# Patient Record
Sex: Female | Born: 1960
Health system: Southern US, Community
[De-identification: ages and names within clinical notes are randomized; demographics above are authoritative.]

## PROBLEM LIST (undated history)

## (undated) DIAGNOSIS — I639 Cerebral infarction, unspecified: Secondary | ICD-10-CM

## (undated) DIAGNOSIS — Z6791 Unspecified blood type, Rh negative: Secondary | ICD-10-CM

## (undated) DIAGNOSIS — D649 Anemia, unspecified: Secondary | ICD-10-CM

## (undated) DIAGNOSIS — E7800 Pure hypercholesterolemia, unspecified: Secondary | ICD-10-CM

## (undated) DIAGNOSIS — I10 Essential (primary) hypertension: Secondary | ICD-10-CM

## (undated) DIAGNOSIS — H5462 Unqualified visual loss, left eye, normal vision right eye: Secondary | ICD-10-CM

## (undated) DIAGNOSIS — C50919 Malignant neoplasm of unspecified site of unspecified female breast: Secondary | ICD-10-CM

## (undated) HISTORY — DX: Unqualified visual loss, left eye, normal vision right eye: H54.62

## (undated) HISTORY — DX: Pure hypercholesterolemia, unspecified: E78.00

## (undated) HISTORY — PX: TUBAL LIGATION: SHX77

## (undated) HISTORY — DX: Cerebral infarction, unspecified: I63.9

## (undated) HISTORY — PX: MASTECTOMY: SHX3

## (undated) HISTORY — DX: Unspecified blood type, rh negative: Z67.91

## (undated) HISTORY — DX: Anemia, unspecified: D64.9

---

## 2005-07-08 ENCOUNTER — Inpatient Hospital Stay (HOSPITAL_COMMUNITY): Admission: EM | Admit: 2005-07-08 | Discharge: 2005-07-11 | Payer: Self-pay | Admitting: Emergency Medicine

## 2006-01-21 HISTORY — PX: LAPAROSCOPIC CHOLECYSTECTOMY: SUR755

## 2011-10-02 ENCOUNTER — Emergency Department (HOSPITAL_COMMUNITY): Payer: No Typology Code available for payment source

## 2011-10-02 ENCOUNTER — Emergency Department (HOSPITAL_COMMUNITY)
Admission: EM | Admit: 2011-10-02 | Discharge: 2011-10-02 | Disposition: A | Discharge status: Home / Self Care | Payer: No Typology Code available for payment source | Attending: Emergency Medicine | Admitting: Emergency Medicine

## 2011-10-02 ENCOUNTER — Encounter (HOSPITAL_COMMUNITY): Payer: Self-pay | Admitting: *Deleted

## 2011-10-02 DIAGNOSIS — Z853 Personal history of malignant neoplasm of breast: Secondary | ICD-10-CM | POA: Insufficient documentation

## 2011-10-02 DIAGNOSIS — I10 Essential (primary) hypertension: Secondary | ICD-10-CM

## 2011-10-02 DIAGNOSIS — T148XXA Other injury of unspecified body region, initial encounter: Secondary | ICD-10-CM

## 2011-10-02 HISTORY — DX: Malignant neoplasm of unspecified site of unspecified female breast: C50.919

## 2011-10-02 MED ORDER — METHOCARBAMOL 500 MG PO TABS: 1000.0000 mg | ORAL_TABLET | Freq: Four times a day (QID) | ORAL | Status: AC

## 2011-10-02 MED ORDER — ACETAMINOPHEN 325 MG PO TABS
650.0000 mg | ORAL_TABLET | Freq: Once | ORAL | Status: AC
  Administered 2011-10-02: 650 mg via ORAL
  Filled 2011-10-02: qty 2

## 2011-10-02 NOTE — ED Notes (Signed)
Rx given x1 Pt ambulating independently w/ steady gait on d/c in no acute distress, A&Ox4. D/c instructions reviewed w/ pt and family - pt and family deny any further questions or concerns at present.  
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
## 2011-10-03 NOTE — ED Provider Notes (Signed)
Medical screening examination/treatment/procedure(s) were performed by non-physician practitioner and as supervising physician I was immediately available for consultation/collaboration.  Juliet Rude. Rubin Payor, MD 10/03/11 2130

## 2013-07-21 DIAGNOSIS — I639 Cerebral infarction, unspecified: Secondary | ICD-10-CM

## 2013-07-21 HISTORY — DX: Cerebral infarction, unspecified: I63.9

## 2013-08-07 ENCOUNTER — Encounter (HOSPITAL_COMMUNITY): Payer: Self-pay | Admitting: Emergency Medicine

## 2013-08-07 ENCOUNTER — Emergency Department (HOSPITAL_COMMUNITY): Payer: BC Managed Care – PPO

## 2013-08-07 ENCOUNTER — Inpatient Hospital Stay (HOSPITAL_COMMUNITY)
Admission: EM | Admit: 2013-08-07 | Discharge: 2013-08-12 | DRG: 305 | Disposition: A | Discharge status: Home / Self Care | Payer: BC Managed Care – PPO | Attending: Internal Medicine | Admitting: Internal Medicine

## 2013-08-07 DIAGNOSIS — Z853 Personal history of malignant neoplasm of breast: Secondary | ICD-10-CM

## 2013-08-07 DIAGNOSIS — I160 Hypertensive urgency: Secondary | ICD-10-CM | POA: Diagnosis present

## 2013-08-07 DIAGNOSIS — H518 Other specified disorders of binocular movement: Secondary | ICD-10-CM | POA: Diagnosis present

## 2013-08-07 DIAGNOSIS — I10 Essential (primary) hypertension: Principal | ICD-10-CM | POA: Diagnosis present

## 2013-08-07 DIAGNOSIS — Z8249 Family history of ischemic heart disease and other diseases of the circulatory system: Secondary | ICD-10-CM

## 2013-08-07 DIAGNOSIS — I674 Hypertensive encephalopathy: Secondary | ICD-10-CM | POA: Diagnosis present

## 2013-08-07 DIAGNOSIS — R1110 Vomiting, unspecified: Secondary | ICD-10-CM

## 2013-08-07 DIAGNOSIS — Z923 Personal history of irradiation: Secondary | ICD-10-CM

## 2013-08-07 DIAGNOSIS — Z800 Family history of malignant neoplasm of digestive organs: Secondary | ICD-10-CM

## 2013-08-07 DIAGNOSIS — Z9010 Acquired absence of unspecified breast and nipple: Secondary | ICD-10-CM

## 2013-08-07 DIAGNOSIS — R112 Nausea with vomiting, unspecified: Secondary | ICD-10-CM | POA: Diagnosis present

## 2013-08-07 DIAGNOSIS — E785 Hyperlipidemia, unspecified: Secondary | ICD-10-CM | POA: Diagnosis present

## 2013-08-07 DIAGNOSIS — E669 Obesity, unspecified: Secondary | ICD-10-CM | POA: Diagnosis present

## 2013-08-07 DIAGNOSIS — Z6841 Body Mass Index (BMI) 40.0 and over, adult: Secondary | ICD-10-CM

## 2013-08-07 HISTORY — DX: Hypertensive urgency: I16.0

## 2013-08-07 HISTORY — DX: Other specified disorders of binocular movement: H51.8

## 2013-08-07 HISTORY — DX: Essential (primary) hypertension: I10

## 2013-08-07 LAB — CBC
HEMATOCRIT: 45.8 % (ref 36.0–46.0)
Hemoglobin: 14.8 g/dL (ref 12.0–15.0)
MCH: 25.8 pg — ABNORMAL LOW (ref 26.0–34.0)
MCHC: 32.3 g/dL (ref 30.0–36.0)
MCV: 79.8 fL (ref 78.0–100.0)
Platelets: 197 10*3/uL (ref 150–400)
RBC: 5.74 MIL/uL — ABNORMAL HIGH (ref 3.87–5.11)
RDW: 14 % (ref 11.5–15.5)
WBC: 8.2 10*3/uL (ref 4.0–10.5)

## 2013-08-07 LAB — COMPREHENSIVE METABOLIC PANEL
ALK PHOS: 95 U/L (ref 39–117)
ALT: 19 U/L (ref 0–35)
ANION GAP: 15 (ref 5–15)
AST: 27 U/L (ref 0–37)
Albumin: 4.1 g/dL (ref 3.5–5.2)
BUN: 14 mg/dL (ref 6–23)
CALCIUM: 9.8 mg/dL (ref 8.4–10.5)
CO2: 21 mEq/L (ref 19–32)
Chloride: 103 mEq/L (ref 96–112)
Creatinine, Ser: 0.62 mg/dL (ref 0.50–1.10)
GLUCOSE: 169 mg/dL — AB (ref 70–99)
Potassium: 4.2 mEq/L (ref 3.7–5.3)
SODIUM: 139 meq/L (ref 137–147)
Total Bilirubin: 0.3 mg/dL (ref 0.3–1.2)
Total Protein: 8.4 g/dL — ABNORMAL HIGH (ref 6.0–8.3)

## 2013-08-07 LAB — URINALYSIS, ROUTINE W REFLEX MICROSCOPIC
BILIRUBIN URINE: NEGATIVE
Glucose, UA: 100 mg/dL — AB
KETONES UR: NEGATIVE mg/dL
LEUKOCYTES UA: NEGATIVE
Nitrite: NEGATIVE
PH: 7.5 (ref 5.0–8.0)
PROTEIN: 30 mg/dL — AB
SPECIFIC GRAVITY, URINE: 1.013 (ref 1.005–1.030)
Urobilinogen, UA: 0.2 mg/dL (ref 0.0–1.0)

## 2013-08-07 LAB — LIPASE, BLOOD: LIPASE: 29 U/L (ref 11–59)

## 2013-08-07 LAB — URINE MICROSCOPIC-ADD ON

## 2013-08-07 MED ORDER — PROMETHAZINE HCL 25 MG/ML IJ SOLN
25.0000 mg | Freq: Once | INTRAMUSCULAR | Status: AC
  Administered 2013-08-07: 25 mg via INTRAVENOUS
  Filled 2013-08-07: qty 1

## 2013-08-07 MED ORDER — CEPHALEXIN 500 MG PO CAPS: 500.0000 mg | ORAL_CAPSULE | Freq: Four times a day (QID) | ORAL | Status: DC

## 2013-08-07 MED ORDER — METOCLOPRAMIDE HCL 5 MG/ML IJ SOLN
10.0000 mg | Freq: Once | INTRAMUSCULAR | Status: AC
  Administered 2013-08-07: 10 mg via INTRAVENOUS
  Filled 2013-08-07: qty 2

## 2013-08-07 MED ORDER — METRONIDAZOLE 500 MG PO TABS: 500.0000 mg | ORAL_TABLET | Freq: Two times a day (BID) | ORAL | Status: DC

## 2013-08-07 MED ORDER — MORPHINE SULFATE 4 MG/ML IJ SOLN
4.0000 mg | Freq: Once | INTRAMUSCULAR | Status: DC
  Filled 2013-08-07: qty 1

## 2013-08-07 MED ORDER — SODIUM CHLORIDE 0.9 % IV BOLUS (SEPSIS)
2000.0000 mL | Freq: Once | INTRAVENOUS | Status: AC
  Administered 2013-08-07: 2000 mL via INTRAVENOUS
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
## 2013-08-07 NOTE — ED Notes (Signed)
Bed: TS17 Expected date:  Expected time:  Means of arrival:  Comments: Ems, n/v

## 2013-08-07 NOTE — ED Notes (Signed)
Family at bedside. 

## 2013-08-07 NOTE — ED Provider Notes (Signed)
  Face-to-face evaluation   History: She began vomiting today, while at home alone. When her husband found her she was on the bed, and unable to get up. She was complaining of dizziness, and now has a headache. She began vomiting after eating some food that had been initially prepared, 5 days ago.  Exam, at 21:15- he is sedated, but able to communicate. She complains of dizziness, and denies abdominal pain  Physical exam: Obese, alert, calm, sleepy. Eyes no nystagmus. Disconjugate gaze with bilateral inability to track an object presented to her. Abdomen is soft and nontender.  Medical screening examination/treatment/procedure(s) were conducted as a shared visit with non-physician practitioner(s) and myself.  I personally evaluated the patient during the encounter  Richarda Blade, MD 08/08/13 1257

## 2013-08-07 NOTE — ED Notes (Addendum)
Per EMS pt coming from home with c/o nausea and vomiting. Pt reports it started about 2 hours ago after eating old stir-fry from the fridge. Pt was hypertensive en- route with b/p 200/100. Pt was given 4mg  zofran by EMS.

## 2013-08-07 NOTE — ED Provider Notes (Signed)
CSN: [id number]     Arrival date & time 08/07/13  1736 History   First MD Initiated Contact with Patient 08/07/13 1737     Chief Complaint  Patient presents with  . Nausea  . Emesis     (Consider location/radiation/quality/duration/timing/severity/associated sxs/prior Treatment) The history is provided by the patient, the spouse, a relative and medical records. No language interpreter was used.    Leslie Graves is a 53 y.o. female  with no known medical problems presents to the Emergency Department complaining of gradual, persistent, progressively worsening N/V and generalized abd pain onset 12:30PM.  Pt reports she ate left over stir-fry and chicken (both > 64 week old) around 11am and then began vomiting afterwards.  Pt reports NBNB emesis, no diarrhea.  NO treatments PTA.  Nothing makes it better or worse.  Pt reports associated dizziness, but she is unable to elaborate or qualify this.   Pt denies fever, chills, headache, neck pain, chest pain, SOB, diarrhea, weakness, syncope, dysuria, hematuria.  Husband reports that pt's last seen well time was 9AM when he left the house and he returned to find her vomiting around 4PM.   Past Medical History  Diagnosis Date  . Breast cancer    Past Surgical History  Procedure Laterality Date  . Cholecystectomy     No family history on file. History  Substance Use Topics  . Smoking status: Never Smoker   . Smokeless tobacco: Not on file  . Alcohol Use: No   OB History   Grav Para Term Preterm Abortions TAB SAB Ect Mult Living                 Review of Systems  Constitutional: Negative for fever, diaphoresis, appetite change, fatigue and unexpected weight change.  HENT: Negative for mouth sores and trouble swallowing.   Respiratory: Negative for cough, chest tightness, shortness of breath, wheezing and stridor.   Cardiovascular: Negative for chest pain and palpitations.  Gastrointestinal: Positive for nausea, vomiting and abdominal pain (  mild, generalized, cramping). Negative for diarrhea, constipation, blood in stool, abdominal distention and rectal pain.  Genitourinary: Negative for dysuria, urgency, frequency, hematuria, flank pain and difficulty urinating.  Musculoskeletal: Negative for back pain, neck pain and neck stiffness.  Skin: Negative for rash.  Neurological: Negative for weakness.  Hematological: Negative for adenopathy.  Psychiatric/Behavioral: Negative for confusion.  All other systems reviewed and are negative.     Allergies  Review of patient's allergies indicates no known allergies.  Home Medications   Prior to Admission medications   Not on File   BP 202/95  Pulse 89  Temp(Src) 98.5 F (36.9 C) (Oral)  Resp 21  SpO2 91% Physical Exam  Nursing note and vitals reviewed. Constitutional: She is oriented to person, place, and time. She appears well-developed and well-nourished. No distress.  Awake, alert, nontoxic appearance  HENT:  Head: Normocephalic and atraumatic.  Mouth/Throat: Oropharynx is clear and moist. No oropharyngeal exudate.  Eyes: Conjunctivae are normal. No scleral icterus.  Neck: Normal range of motion. Neck supple.  Cardiovascular: Normal rate, regular rhythm, normal heart sounds and intact distal pulses.   No murmur heard. RRR  Pulmonary/Chest: Effort normal and breath sounds normal. No respiratory distress. She has no wheezes.  Clear and equal breath sounds  Abdominal: Soft. Bowel sounds are normal. She exhibits no distension and no mass. There is no tenderness. There is no rebound and no guarding.  abd soft and nontender  Musculoskeletal: Normal range of motion. She  exhibits no edema.  Neurological: She is alert and oriented to person, place, and time. She exhibits normal muscle tone. Coordination normal.  Speech is clear and goal oriented Moves extremities without ataxia Pt alert and oriented x4; appropriately interacting and answering all questions without difficulty.   Skin: Skin is warm and dry. She is not diaphoretic.  Psychiatric: She has a normal mood and affect.    ED Course  Procedures (including critical care time) Labs Review Labs Reviewed  URINALYSIS, ROUTINE W REFLEX MICROSCOPIC - Abnormal; Notable for the following:    Glucose, UA 100 (*)    Hgb urine dipstick TRACE (*)    Protein, ur 30 (*)    All other components within normal limits  COMPREHENSIVE METABOLIC PANEL - Abnormal; Notable for the following:    Glucose, Bld 169 (*)    Total Protein 8.4 (*)    All other components within normal limits  CBC - Abnormal; Notable for the following:    RBC 5.74 (*)    MCH 25.8 (*)    All other components within normal limits  LIPASE, BLOOD  URINE MICROSCOPIC-ADD ON    Imaging Review Ct Head Wo Contrast  08/07/2013   CLINICAL DATA:  Dizziness, elevated blood pressure  EXAM: CT HEAD WITHOUT CONTRAST  TECHNIQUE: Contiguous axial images were obtained from the base of the skull through the vertex without intravenous contrast.  COMPARISON:  None.  FINDINGS: There is no evidence of mass effect, midline shift or extra-axial fluid collections. There is no evidence of a space-occupying lesion or intracranial hemorrhage. There is no evidence of a cortical-based area of acute infarction.  The ventricles and sulci are appropriate for the patient's age. The basal cisterns are patent.  Visualized portions of the orbits are unremarkable. The visualized portions of the paranasal sinuses and mastoid air cells are unremarkable.  The osseous structures are unremarkable.  IMPRESSION: Normal CT of the brain without intravenous contrast.   Electronically Signed   By: Kathreen Devoid   On: 08/07/2013 21:51     EKG Interpretation None      MDM   Final diagnoses:  Essential hypertension  Intractable vomiting with nausea, vomiting of unspecified type   Leslie Graves presents with intermittent bouts of emesis reoccurring since 12:30 today.  Emesis is NBNB and stomach  contents. Abd soft and nontender on exam.    6:33 PM Pt BP cuff does not fit her very large arm.  Manual BP 172/P.    7:30PM Pt continues to vomit.  No leukocytosis and CMP without abnormality.  UA pending.  8:19PM UA without evidence of UTI.  Will continue to monitor.  Pt continues to read HTN on monitor; no tachycardia.    9:26 PM Pt remains HTN on the monitor.  Repeat Manual BP 168/P.  Pt's last emesis approx 1 hour ago.  Pt assessed again by myself and Dr. Eulis Foster.  Pt is now having trouble tracking with her eyes; disconjugate gaze, which was not present on initial PE.  Abd remains soft and nontender.  Will obtain CT scan of her head.    10:04 PM CT head without acute abnormality.  Pt continues to c/o dizziness.  TPA not given because LSW time excludes her from qualifying for TPA.  Will consult Neurology.  No stroke swallow screen performed due to persistent vomiting.  10:15PM Discussed with Dr. Doy Mince who recommends admission to [facility] for further evaluation and MRI in the morning.    11:22 PM Discussed with Dr.  Barrino who will admit and transfer to [place], PA-C 08/07/13 2324  12:04 AM Manual BP with thigh cuff:  218/106  [facility], PA-C 08/08/13 0004  Leslie Campanelli, PA-C 08/08/13 6761

## 2013-08-08 ENCOUNTER — Encounter (HOSPITAL_COMMUNITY): Payer: Self-pay | Admitting: Family Medicine

## 2013-08-08 ENCOUNTER — Inpatient Hospital Stay (HOSPITAL_COMMUNITY): Payer: BC Managed Care – PPO

## 2013-08-08 DIAGNOSIS — R1115 Cyclical vomiting syndrome unrelated to migraine: Secondary | ICD-10-CM

## 2013-08-08 DIAGNOSIS — R42 Dizziness and giddiness: Secondary | ICD-10-CM

## 2013-08-08 DIAGNOSIS — H518 Other specified disorders of binocular movement: Secondary | ICD-10-CM

## 2013-08-08 DIAGNOSIS — R112 Nausea with vomiting, unspecified: Secondary | ICD-10-CM | POA: Diagnosis present

## 2013-08-08 DIAGNOSIS — I10 Essential (primary) hypertension: Secondary | ICD-10-CM

## 2013-08-08 LAB — LIPID PANEL
Cholesterol: 213 mg/dL — ABNORMAL HIGH (ref 0–200)
HDL: 64 mg/dL (ref >39)
LDL Cholesterol: 136 mg/dL — ABNORMAL HIGH (ref 0–99)
Total CHOL/HDL Ratio: 3.3 RATIO
Triglycerides: 67 mg/dL (ref <150)
VLDL: 13 mg/dL (ref 0–40)

## 2013-08-08 LAB — HEMOGLOBIN A1C
Hgb A1c MFr Bld: 5.8 % — ABNORMAL HIGH (ref <5.7)
MEAN PLASMA GLUCOSE: 120 mg/dL — AB (ref <117)

## 2013-08-08 LAB — CBC
HCT: 40 % (ref 36.0–46.0)
HEMOGLOBIN: 12.7 g/dL (ref 12.0–15.0)
MCH: 25.1 pg — AB (ref 26.0–34.0)
MCHC: 31.8 g/dL (ref 30.0–36.0)
MCV: 79.1 fL (ref 78.0–100.0)
Platelets: 207 10*3/uL (ref 150–400)
RBC: 5.06 MIL/uL (ref 3.87–5.11)
RDW: 14.2 % (ref 11.5–15.5)
WBC: 7.5 10*3/uL (ref 4.0–10.5)

## 2013-08-08 LAB — TSH: TSH: 0.768 u[IU]/mL (ref 0.350–4.500)

## 2013-08-08 LAB — BASIC METABOLIC PANEL
Anion gap: 17 — ABNORMAL HIGH (ref 5–15)
BUN: 10 mg/dL (ref 6–23)
CALCIUM: 9 mg/dL (ref 8.4–10.5)
CO2: 21 mEq/L (ref 19–32)
Chloride: 101 mEq/L (ref 96–112)
Creatinine, Ser: 0.62 mg/dL (ref 0.50–1.10)
GFR calc Af Amer: >90 mL/min (ref >90)
Glucose, Bld: 117 mg/dL — ABNORMAL HIGH (ref 70–99)
Potassium: 4 mEq/L (ref 3.7–5.3)
Sodium: 139 mEq/L (ref 137–147)

## 2013-08-08 LAB — TROPONIN I: Troponin I: <0.3 ng/mL (ref <0.30)

## 2013-08-08 LAB — PROTIME-INR
INR: 1 (ref 0.00–1.49)
Prothrombin Time: 13.2 seconds (ref 11.6–15.2)

## 2013-08-08 LAB — APTT: APTT: 25 s (ref 24–37)

## 2013-08-08 MED ORDER — PROMETHAZINE HCL 25 MG/ML IJ SOLN
12.5000 mg | Freq: Four times a day (QID) | INTRAMUSCULAR | Status: DC | PRN
  Administered 2013-08-08 – 2013-08-09 (×4): 12.5 mg via INTRAVENOUS
  Filled 2013-08-08 (×4): qty 1

## 2013-08-08 MED ORDER — SODIUM CHLORIDE 0.9 % IV SOLN
INTRAVENOUS | Status: DC
Start: 2013-08-08 — End: 2013-08-12
  Administered 2013-08-08: 100 mL/h via INTRAVENOUS
  Administered 2013-08-08 – 2013-08-10 (×4): via INTRAVENOUS
  Administered 2013-08-11: 100 mL/h via INTRAVENOUS

## 2013-08-08 MED ORDER — SODIUM CHLORIDE 0.9 % IJ SOLN
3.0000 mL | Freq: Two times a day (BID) | INTRAMUSCULAR | Status: DC
  Administered 2013-08-08 – 2013-08-11 (×5): 3 mL via INTRAVENOUS

## 2013-08-08 MED ORDER — ONDANSETRON HCL 4 MG/2ML IJ SOLN
4.0000 mg | Freq: Four times a day (QID) | INTRAMUSCULAR | Status: DC | PRN
  Administered 2013-08-08 – 2013-08-11 (×5): 4 mg via INTRAVENOUS
  Filled 2013-08-08 (×5): qty 2

## 2013-08-08 MED ORDER — HYDRALAZINE HCL 20 MG/ML IJ SOLN
10.0000 mg | Freq: Four times a day (QID) | INTRAMUSCULAR | Status: DC | PRN
  Administered 2013-08-08 – 2013-08-11 (×10): 10 mg via INTRAVENOUS
  Filled 2013-08-08 (×10): qty 1

## 2013-08-08 MED ORDER — HYDRALAZINE HCL 20 MG/ML IJ SOLN
5.0000 mg | Freq: Once | INTRAMUSCULAR | Status: DC
  Filled 2013-08-08: qty 1

## 2013-08-08 MED ORDER — MORPHINE SULFATE 2 MG/ML IJ SOLN
2.0000 mg | Freq: Four times a day (QID) | INTRAMUSCULAR | Status: DC | PRN
  Administered 2013-08-08 – 2013-08-10 (×7): 2 mg via INTRAVENOUS
  Filled 2013-08-08 (×7): qty 1

## 2013-08-08 MED ORDER — ASPIRIN EC 81 MG PO TBEC
81.0000 mg | DELAYED_RELEASE_TABLET | Freq: Every day | ORAL | Status: DC
  Administered 2013-08-08 – 2013-08-12 (×5): 81 mg via ORAL
  Filled 2013-08-08 (×5): qty 1

## 2013-08-08 MED ORDER — GADOBENATE DIMEGLUMINE 529 MG/ML IV SOLN
20.0000 mL | Freq: Once | INTRAVENOUS | Status: AC | PRN
  Administered 2013-08-08: 20 mL via INTRAVENOUS

## 2013-08-08 NOTE — Progress Notes (Signed)
[facility] hospitalist progress note. Chief complaint. Transfer note. History of present illness. This 53 year old female presented to [facility] emergency room with complaints of nausea and vomiting. She was noted that the been hypertensive urgency with systolic blood pressure of 200. He was noted to have disconjugate gaze. CT scan with contrast was performed and negative for any acute pathology. Patient to was transferred to [facility] for stroke workup including MRI of the brain and now has arrived. I'm seeing the patient at bedside to ensure she remains stable post discharge and then orders transferred appropriately. Patient denies headache currently. She denies any visual changes. Vital signs. Temperature 90.9, pulse 98, respiration 20, blood pressure 178/93. Previous blood pressure 227/107. General appearance. Obese middle-aged female alert and in no distress. Cardiac. Rate and rhythm regular. Lungs. Breath sounds clear and equal. Abdomen. Soft with positive bowel sounds. Neurologic. Cranial nerves 2-12 grossly intact. I did not see evidence of a disconjugate gaze. Strength 5/5 and equal upper extremities. Grips strong and equal. Lower extremity 4/5 and equal. Impression/plan. Problem #1. Nausea and vomiting. Etiology as yet unclear. Patient has no current complaints of nausea. Problem #2. Hypertensive urgency. Initial admission blood pressure 227/107 to this is improved to current 178/93. I've ordered hydralazine 10 mg IV every 6 hours as needed for systolic blood pressure greater than 180 with permissive hypertension at this time due to rule out CVA. Problem #3. Disconjugate gaze. Patient for CVA rule out. The patient will be sent to MRI for brain scan to rule out stroke. I will notify neural hospitalist of patient's arrival. We'll follow for any further neurology recommendations.

## 2013-08-08 NOTE — Evaluation (Signed)
Clinical/Bedside Swallow Evaluation Patient Details  Name: Jerry Haugen MRN: [id number] Date of Birth: [birth year]  Today's Date: 08/08/2013 Time: 1130-1158 SLP Time Calculation (min): 28 min  Past Medical History:  Past Medical History  Diagnosis Date  . Breast cancer   . Hypertension    Past Surgical History:  Past Surgical History  Procedure Laterality Date  . Cholecystectomy     HPI:  Ms. Cenci is a 53 y.o AAF who presented to the ER with family with stroke like symptoms.  Assessment / Plan / Recommendation Clinical Impression  BSE completed per Stroke Protocol.  Oropharyngeal swallow functional for regular consistency and thin liquids.  No s/s of aspiration noted throughout evaluation.  Recommend to initiate diet as tolerated secondary to patient presenting with nausea.  ST to follow briefly in acute care setting for diet tolerance due to current episode of weakness.      Aspiration Risk  Mild    Diet Recommendation Regular;Thin liquid   Liquid Administration via: Cup;Straw Medication Administration: Whole meds with liquid Supervision: Patient able to self feed    Other  Recommendations Oral Care Recommendations: Oral care BID Other Recommendations: Clarify dietary restrictions   Follow Up Recommendations  None    Frequency and Duration min 1 x/week  1 week       SLP Swallow Goals Please refer to Care Plans for listed goals    Swallow Study Prior Functional Status   No prior reports of dysphagia     General Date of Onset: 08/07/13 HPI: Ms. Wiginton is a 53 y.o AAF who presented to the ER with family. She was alert and cooperative during the encounter with admitting physician. The husband stated that he left her in the morning around 9 am in perfectly good health. Their son and the husband arrived to the house around 4pm and found where she had vomited on herself. The husband also stated she was very weak in her legs and could barely walk. She had eaten some  frozen stir fry dinner shortly before the vomiting and they thought this may be why she was vomiting. When she arrived to the ED, she was noted to be hypertensive in 200 SBP and the phenergan and reglan did not seem to help with her vomiting. She continued to vomit bilious substances while in the ED after the medication. Also of note, upon re-examining the patient, the ED MD noted a dysconjugate gaze so a CT of the head without contrast was performed which was negative for any acute pathology. Neurology was consulted and Dr. Doy Mince suggested a MRI of the brain and the patient would need to be transferred to [facility]. Type of Study: Bedside swallow evaluation Diet Prior to this Study: NPO Temperature Spikes Noted: No Respiratory Status: Room air Behavior/Cognition: Alert;Cooperative Oral Cavity - Dentition: Missing dentition Self-Feeding Abilities: Able to feed self Patient Positioning: Upright in bed Baseline Vocal Quality: Clear Volitional Cough: Strong Volitional Swallow: Able to elicit    Oral/Motor/Sensory Function Overall Oral Motor/Sensory Function: Appears within functional limits for tasks assessed   Ice Chips Ice chips: Not tested   Thin Liquid Thin Liquid: Within functional limits Presentation: Cup;Straw    Nectar Thick Nectar Thick Liquid: Not tested   Honey Thick Honey Thick Liquid: Not tested   Puree Puree: Within functional limits   Solid   GO    Solid: Within functional limits      Sharman Crate [place], [facility] 08/08/2013,11:58 AM

## 2013-08-08 NOTE — ED Notes (Signed)
MD at bedside. 

## 2013-08-08 NOTE — Progress Notes (Signed)
Report received from [facility] E.D

## 2013-08-08 NOTE — Progress Notes (Signed)
[facility]                                                                              Patient Demographics  Leslie Graves, is a 53 y.o. female, DOB - 1960/05/29, VOH:[id number]  Admit date - 08/07/2013   Admitting Physician Sandi Mealy, MD  Outpatient Primary MD for the patient is No primary provider on file.  LOS - 1   Chief Complaint  Patient presents with  . Nausea  . Emesis      HPI on 7/18//2015 Leslie Graves is a 53 y.o AAF who presented to the ER with family. She was alert and cooperative during the encounter with admitting physician. The husband stated that he left her in the morning around 9 am in perfectly good health. Their son and the husband arrived to the house around 4pm and found where she had vomited on herself. The husband also stated she was very weak in her legs and could barely walk. She had eaten some frozen stir fry dinner shortly before the vomiting and they thought this may be why she was vomiting. When she arrived to the ED, she was noted to be hypertensive in 200 SBP and the phenergan and reglan did not seem to help with her vomiting. She continued to vomit bilious substances while in the ED after the medication. Also of note, upon re-examining the patient, the ED MD noted a dysconjugate gaze so a CT of the head without contrast was performed which was negative for any acute pathology. Neurology was consulted and Dr. Doy Mince suggested a MRI of the brain and the patient would need to be transferred to [facility].   Assessment & Plan  Dizziness with Nausea/Vomiting, Concern for CVA vs Hypertensive Encephalopathy -Patient was transferred from [facility] long emergency department to [place], in -CT of the head showed no acute changes -Pending MRI of the brain -Pending results of MRI, patient will have echocardiogram and carotid Doppler, hemoglobin A1c, lipid panel -Will continue neuro checks -Neurology consulted and following -Continue aspirin 81  mg  Nausea and vomiting -Likely related to the above versus gastroenteritis -She currently afebrile no leukocytosis -Continue supportive care and antibiotics and IVF  Accelerated hypertension -Will continue patient on hydralazine as needed  History of breast cancer -Status post mastectomy and radiation in 1997, currently in remission  Obesity, BMI 119 -Patient will need to discuss with her primary care physician and exercise as well as diet modification program.  Code Status: Full   Family Communication: family at bedside  Disposition Plan: Admitted  Time Spent in minutes   30 minutes  Procedures  None  Consults   Neurology   DVT Prophylaxis  SCDs  Lab Results  Component Value Date   PLT 207 08/08/2013    Medications  Scheduled Meds: . aspirin EC  81 mg Oral Daily  . hydrALAZINE  5 mg Intravenous Once  . sodium chloride  3 mL Intravenous Q12H   Continuous Infusions: . sodium chloride 100 mL/hr (08/08/13 0348)   PRN Meds:.hydrALAZINE, ondansetron (ZOFRAN) IV, promethazine  Antibiotics    Anti-infectives   Start     Dose/Rate Route Frequency Ordered Stop   08/07/13 0000  cephALEXin (KEFLEX) 500 MG capsule  Status:  Discontinued     500 mg Oral 4 times daily 08/07/13 2206 08/07/13    08/07/13 0000  metroNIDAZOLE (FLAGYL) 500 MG tablet  Status:  Discontinued     500 mg Oral 2 times daily 08/07/13 2206 08/07/13       Subjective:   Leslie Graves seen and examined today.  Patient states she is no longer in vomiting at this time. She continues to feel nauseous and sleepy. Patient denies any abdominal pain at this time. She denies any dizziness. Patient does state that she felt weak yesterday and that she is unable to walk. She continues to complain of weakness. Patient currently denies any chest pain or shortness of breath.   Objective:   Filed Vitals:   08/08/13 0322 08/08/13 0436 08/08/13 0558 08/08/13 0812  BP: 178/93 193/93 188/86 177/77  Pulse: 98 85 83  90  Temp: 99 F (37.2 C) 99.1 F (37.3 C) 99.7 F (37.6 C) 99.1 F (37.3 C)  TempSrc: Oral Oral Oral Oral  Resp: 20 18 20 20   Height: 5\' 4"  (1.626 m)     Weight: 314.1 kg (692 lb 7.4 oz)     SpO2: 98% 95% 97% 95%    Wt Readings from Last 3 Encounters:  08/08/13 314.1 kg (692 lb 7.4 oz)     Intake/Output Summary (Last 24 hours) at 08/08/13 1012 Last data filed at 08/08/13 0600  Gross per 24 hour  Intake      0 ml  Output    100 ml  Net   -100 ml    Exam  General: Well developed, well nourished, NAD, appears stated age  HEENT: NCAT, PERRLA, EOMI, Anicteic Sclera, mucous membranes moist.   Neck: Supple, no JVD, no masses  Cardiovascular: S1 S2 auscultated, no rubs, murmurs or gallops. Regular rate and rhythm.  Respiratory: Clear to auscultation bilaterally with equal chest rise  Abdomen: Soft, obese, nontender, nondistended, + bowel sounds  Extremities: warm dry without cyanosis clubbing or edema  Neuro: AAOx3, cranial nerves grossly intact. Strength 5/5 in patient's upper and lower extremities bilaterally, but tires out with strength testing  Skin: Without rashes exudates or nodules  Psych: Normal affect and demeanor with intact judgement and insight  Data Review   Micro Results No results found for this or any previous visit (from the past 240 hour(s)).  Radiology Reports Ct Head Wo Contrast  08/07/2013   CLINICAL DATA:  Dizziness, elevated blood pressure  EXAM: CT HEAD WITHOUT CONTRAST  TECHNIQUE: Contiguous axial images were obtained from the base of the skull through the vertex without intravenous contrast.  COMPARISON:  None.  FINDINGS: There is no evidence of mass effect, midline shift or extra-axial fluid collections. There is no evidence of a space-occupying lesion or intracranial hemorrhage. There is no evidence of a cortical-based area of acute infarction.  The ventricles and sulci are appropriate for the patient's age. The basal cisterns are patent.   Visualized portions of the orbits are unremarkable. The visualized portions of the paranasal sinuses and mastoid air cells are unremarkable.  The osseous structures are unremarkable.  IMPRESSION: Normal CT of the brain without intravenous contrast.   Electronically Signed   By: Kathreen Devoid   On: 08/07/2013 21:51    CBC  Recent Labs Lab 08/07/13 1833 08/08/13 0518  WBC 8.2 7.5  HGB 14.8 12.7  HCT 45.8 40.0  PLT 197 207  MCV 79.8 79.1  MCH 25.8* 25.1*  MCHC 32.3 31.8  RDW 14.0 14.2    Chemistries   Recent Labs Lab 08/07/13 1833 08/08/13 0518  NA 139 139  K 4.2 4.0  CL 103 101  CO2 21 21  GLUCOSE 169* 117*  BUN 14 10  CREATININE 0.62 0.62  CALCIUM 9.8 9.0  AST 27  --   ALT 19  --   ALKPHOS 95  --   BILITOT 0.3  --    ------------------------------------------------------------------------------------------------------------------ estimated creatinine clearance is 201.2 ml/min (by C-G formula based on Cr of 0.62). ------------------------------------------------------------------------------------------------------------------ No results found for this basename: HGBA1C,  in the last 72 hours ------------------------------------------------------------------------------------------------------------------ No results found for this basename: CHOL, HDL, LDLCALC, TRIG, CHOLHDL, LDLDIRECT,  in the last 72 hours ------------------------------------------------------------------------------------------------------------------ No results found for this basename: TSH, T4TOTAL, FREET3, T3FREE, THYROIDAB,  in the last 72 hours ------------------------------------------------------------------------------------------------------------------ No results found for this basename: VITAMINB12, FOLATE, FERRITIN, TIBC, IRON, RETICCTPCT,  in the last 72 hours  Coagulation profile  Recent Labs Lab 08/08/13 0518  INR 1.00    No results found for this basename: DDIMER,  in the last 72  hours  Cardiac Enzymes  Recent Labs Lab 08/08/13 0518  TROPONINI <0.30   ------------------------------------------------------------------------------------------------------------------ No components found with this basename: POCBNP,     Dekota Kirlin D.O. on 08/08/2013 at 10:12 AM  Between 7am to 7pm - Pager - [phone]  After 7pm go to [email or web address] - password [email or web address]  And look for the night coverage person covering for me after hours  [facility] Office  [phone]

## 2013-08-08 NOTE — Progress Notes (Signed)
Patient arrived from [facility] E.D via care link. N.P paged

## 2013-08-08 NOTE — Progress Notes (Signed)
MRI contacted and told of the importance of getting the MRI done today. At this time, they cannot give a definite time for when the MRI will be completed, because they are "slammed from the ED," but they said that they would get to Leslie Graves "soon."

## 2013-08-08 NOTE — Progress Notes (Signed)
Utilization Review Completed.Leslie Graves T7/19/2015  

## 2013-08-08 NOTE — H&P (Addendum)
Leslie Graves is an 53 y.o. female.   Chief Complaint: N/V  HPI: Leslie Graves is a 53 y.o AAF here with her husband and son.  She is alert and cooperative during the encounter with me.  The husband states he left her this morning around 9 am in perfectly good health. Their son and the husband arrived to the house around 4pm and found where she had vomited on herself.  The husband also states she was very weak in her legs and could barely walk. She had eaten some frozen stir fry dinner shortly before the vomiting and they thought this may be why she was vomiting. When she arrived to the ED, she was noted to be hypertensive in 200 SBP and the phenergan and reglan didn't seem to help with her vomiting. She continued to vomit bilious substances while in the ED after the medication. Also of note, upon re-examining the patient, the ED MD noted a dysconjugate gaze so a CT of the head without contrast was performed which was negative for any acute pathology. Neurology was consulted and DR. Reynolds suggested a MRI of the brain and the patient would need to be transferred to [facility] for this evaluation.   Past Medical History  Diagnosis Date  . Breast cancer     Past Surgical History  Procedure Laterality Date  . Cholecystectomy      No family history on file. Social History:  reports that she has never smoked. She does not have any smokeless tobacco history on file. She reports that she does not drink alcohol or use illicit drugs.  Allergies: No Known Allergies   (Not in a hospital admission)  Results for orders placed during the hospital encounter of 08/07/13 (from the past 48 hour(s))  COMPREHENSIVE METABOLIC PANEL     Status: Abnormal   Collection Time    08/07/13  6:33 PM      Result Value Ref Range   Sodium 139  137 - 147 mEq/L   Potassium 4.2  3.7 - 5.3 mEq/L   Comment: MODERATE HEMOLYSIS     HEMOLYSIS AT THIS LEVEL MAY AFFECT RESULT   Chloride 103  96 - 112 mEq/L   CO2 21  19 - 32  mEq/L   Glucose, Bld 169 (*) 70 - 99 mg/dL   BUN 14  6 - 23 mg/dL   Creatinine, Ser 0.62  0.50 - 1.10 mg/dL   Calcium 9.8  8.4 - 10.5 mg/dL   Total Protein 8.4 (*) 6.0 - 8.3 g/dL   Albumin 4.1  3.5 - 5.2 g/dL   AST 27  0 - 37 U/L   Comment: MODERATE HEMOLYSIS     HEMOLYSIS AT THIS LEVEL MAY AFFECT RESULT   ALT 19  0 - 35 U/L   Comment: MODERATE HEMOLYSIS     HEMOLYSIS AT THIS LEVEL MAY AFFECT RESULT   Alkaline Phosphatase 95  39 - 117 U/L   Comment: MODERATE HEMOLYSIS     HEMOLYSIS AT THIS LEVEL MAY AFFECT RESULT   Total Bilirubin 0.3  0.3 - 1.2 mg/dL   GFR calc non Af Amer >90  >90 mL/min   GFR calc Af Amer >90  >90 mL/min   Comment: (NOTE)     The eGFR has been calculated using the CKD EPI equation.     This calculation has not been validated in all clinical situations.     eGFR's persistently <90 mL/min signify possible Chronic Kidney     Disease.  Anion gap 15  5 - 15  CBC     Status: Abnormal   Collection Time    08/07/13  6:33 PM      Result Value Ref Range   WBC 8.2  4.0 - 10.5 K/uL   RBC 5.74 (*) 3.87 - 5.11 MIL/uL   Hemoglobin 14.8  12.0 - 15.0 g/dL   HCT 96.0  45.4 - 09.8 %   MCV 79.8  78.0 - 100.0 fL   MCH 25.8 (*) 26.0 - 34.0 pg   MCHC 32.3  30.0 - 36.0 g/dL   RDW 11.9  14.7 - 82.9 %   Platelets 197  150 - 400 K/uL  LIPASE, BLOOD     Status: None   Collection Time    08/07/13  6:33 PM      Result Value Ref Range   Lipase 29  11 - 59 U/L  URINALYSIS, ROUTINE W REFLEX MICROSCOPIC     Status: Abnormal   Collection Time    08/07/13  7:47 PM      Result Value Ref Range   Color, Urine YELLOW  YELLOW   APPearance CLEAR  CLEAR   Specific Gravity, Urine 1.013  1.005 - 1.030   pH 7.5  5.0 - 8.0   Glucose, UA 100 (*) NEGATIVE mg/dL   Hgb urine dipstick TRACE (*) NEGATIVE   Bilirubin Urine NEGATIVE  NEGATIVE   Ketones, ur NEGATIVE  NEGATIVE mg/dL   Protein, ur 30 (*) NEGATIVE mg/dL   Urobilinogen, UA 0.2  0.0 - 1.0 mg/dL   Nitrite NEGATIVE  NEGATIVE    Leukocytes, UA NEGATIVE  NEGATIVE  URINE MICROSCOPIC-ADD ON     Status: None   Collection Time    08/07/13  7:47 PM      Result Value Ref Range   Squamous Epithelial / LPF RARE  RARE   WBC, UA 0-2  <3 WBC/hpf   Ct Head Wo Contrast  08/07/2013   CLINICAL DATA:  Dizziness, elevated blood pressure  EXAM: CT HEAD WITHOUT CONTRAST  TECHNIQUE: Contiguous axial images were obtained from the base of the skull through the vertex without intravenous contrast.  COMPARISON:  None.  FINDINGS: There is no evidence of mass effect, midline shift or extra-axial fluid collections. There is no evidence of a space-occupying lesion or intracranial hemorrhage. There is no evidence of a cortical-based area of acute infarction.  The ventricles and sulci are appropriate for the patient's age. The basal cisterns are patent.  Visualized portions of the orbits are unremarkable. The visualized portions of the paranasal sinuses and mastoid air cells are unremarkable.  The osseous structures are unremarkable.  IMPRESSION: Normal CT of the brain without intravenous contrast.   Electronically Signed   By: Elige Ko   On: 08/07/2013 21:51    Review of Systems  Constitutional: Positive for malaise/fatigue. Negative for fever, chills, weight loss and diaphoresis.  HENT: Negative for ear discharge, ear pain, hearing loss and tinnitus.   Respiratory: Negative for cough, hemoptysis, sputum production and shortness of breath.   Cardiovascular: Negative for chest pain, palpitations, orthopnea and claudication.  Gastrointestinal: Positive for nausea and vomiting. Negative for abdominal pain, diarrhea, constipation and blood in stool.  Genitourinary: Negative for dysuria, urgency, frequency and hematuria.  Musculoskeletal: Negative for back pain, joint pain, myalgias and neck pain.  Skin: Negative for itching and rash.  Neurological: Positive for focal weakness. Negative for dizziness, tingling, tremors, sensory change, speech change,  weakness and headaches.  Psychiatric/Behavioral: Negative  for depression, suicidal ideas and substance abuse.    Blood pressure 202/95, pulse 89, temperature 98.5 F (36.9 C), temperature source Oral, resp. rate 21, SpO2 91.00%. Physical Exam  Nursing note and vitals reviewed. Constitutional: She is oriented to person, place, and time.  obesed AAF in NAD, falling asleep during encounter 2/2 medication for vomiting, but arousable with voice. Able to follow commands and alert during exam  HENT:  Head: Normocephalic and atraumatic.  Right Ear: External ear normal.  Left Ear: External ear normal.  Nose: Nose normal.  Mouth/Throat: Oropharynx is clear and moist.  Eyes: Conjunctivae are normal. Pupils are equal, round, and reactive to light.  Dysconjugate gaze during EOM exam  Neck: Normal range of motion. Neck supple. No thyromegaly present.  Cardiovascular: Normal rate, regular rhythm, normal heart sounds and intact distal pulses.   Respiratory: Effort normal and breath sounds normal. No respiratory distress. She has no wheezes.  GI: Soft. Bowel sounds are normal. She exhibits no distension. There is no tenderness. There is no rebound and no guarding.  Musculoskeletal:  5/5 bilateral upper and lower extremity strength with 5/5 grip strength bilaterally  Neurological: She is alert and oriented to person, place, and time. She displays normal reflexes. She exhibits normal muscle tone. Coordination normal.  Gait not assessed due to obesity and patient actively vomiting during my encounter  Other than dysconjugate gaze, no other focal deficits noted  Skin: Skin is warm and dry.  Psychiatric: She has a normal mood and affect. Her behavior is normal. Thought content normal.     Assessment/Plan Diagnosis  N&V (nausea and vomiting) - unsure if this is from an acute gastroenteritis etiology or due to hypertensive encephalopathy? She has received reglan and phenergan without alleviation of her  vomiting. She has hypertension with SBP in the 200-220s and her n/v could very well be from a neurological source, ie neurologic emergency in the setting of an acute stroke?  Hypertensive urgency/emergency? - CT scan of head was negative. Neurology consulted and recommended MRI at [facility] to rule out CVA/etiology of her n/v.  She continues to vomit while in the ED despite antiemetics and she is very obesed to where most of the arm bp cuffs don't fit. A manual bp was attempted by PA using a thigh cuff and this read in >200s. Unsure if this is a CVA i.e ICH as the cause of her N/V and we don't want to drop bp too low. According to AHA/Stroke guidelines, whether an ischemic or hemorrhagic CVA is present, her BP is elevated and warrants treatment.  I have given Hydralazine 57m IV x 1 dose since her SBP is well above this point. I have also [facility] inform [facility] and orders are placed for MRI in the a.m. Have admitted to Dr. DRoel Cluckwith stroke protocol orders.   Dysconjugate gaze -as stated above, rule out CVA.   Full Code I have spoken with family and patient and they are in agreement with plan of care [facility] rule out CVA.  BSandi Mealy7/19/2015, 12:11 AM   C

## 2013-08-08 NOTE — Consult Note (Signed)
Referring Physician: Roel Cluck    Chief Complaint: Nausea, vomiting, dizziness  HPI: Leslie Graves is an 53 y.o. female who has been in her usual state of health until about 1pm today when she began to have dizziness and nausea/vomiting.  The dizziness was described as the room spinning. Was so severe that the patient was unable to obtain balance to walk well and was stumbling around the house.  She reports dizziness in all positions.  It is worsened with any movement.  The patient has had no fever.  No one in the house has had a GI illness.  Due to her severe dizziness EMS was called and the patient was brought in for evaluation.  On initial presentation BP was elevated at 225/93  Date last known well: Date: 08/07/2013 Time last known well: Time: 13:00 tPA Given: No: Outside time window  Past Medical History  Diagnosis Date  . Breast cancer   . Hypertension     Past Surgical History  Procedure Laterality Date  . Cholecystectomy      Family history: Father dies of a massive MI, mother died of pancreatic cancer  Social History:  reports that she has never smoked. She does not have any smokeless tobacco history on file. She reports that she does not drink alcohol or use illicit drugs.  Allergies: No Known Allergies  Medications:  I have reviewed the patient's current medications. Prior to Admission:  No prescriptions prior to admission   Scheduled: . hydrALAZINE  5 mg Intravenous Once  . sodium chloride  3 mL Intravenous Q12H    ROS: History obtained from the patient  General ROS: negative for - chills, fatigue, fever, night sweats, weight gain or weight loss Psychological ROS: negative for - behavioral disorder, hallucinations, memory difficulties, mood swings or suicidal ideation Ophthalmic ROS: negative for - blurry vision, double vision, eye pain or loss of vision ENT ROS: negative for - epistaxis, nasal discharge, oral lesions, sore throat, tinnitus  Allergy and Immunology  ROS: negative for - hives or itchy/watery eyes Hematological and Lymphatic ROS: negative for - bleeding problems, bruising or swollen lymph nodes Endocrine ROS: negative for - galactorrhea, hair pattern changes, polydipsia/polyuria or temperature intolerance Respiratory ROS: negative for - cough, hemoptysis, shortness of breath or wheezing Cardiovascular ROS: negative for - chest pain, dyspnea on exertion, edema or irregular heartbeat Gastrointestinal ROS: negative for - abdominal pain, diarrhea, hematemesis or stool incontinence Genito-Urinary ROS: negative for - dysuria, hematuria, incontinence or urinary frequency/urgency Musculoskeletal ROS: negative for - joint swelling or muscular weakness Neurological ROS: as noted in HPI Dermatological ROS: negative for rash and skin lesion changes  Physical Examination: Blood pressure 188/86, pulse 83, temperature 99.7 F (37.6 C), temperature source Oral, resp. rate 20, height 5\' 4"  (1.626 m), weight 314.1 kg (692 lb 7.4 oz), SpO2 97.00%.  Neurologic Examination: Mental Status: Alert, oriented, thought content appropriate.  Speech fluent without evidence of aphasia.  Able to follow 3 step commands without difficulty. Cranial Nerves: II: Discs flat bilaterally; Visual fields grossly normal, pupils equal, round, reactive to light and accommodation III,IV, VI: ptosis not present, extra-ocular motions intact bilaterally V,VII: smile symmetric, facial light touch sensation normal bilaterally VIII: hearing normal bilaterally IX,X: gag reflex present XI: bilateral shoulder shrug XII: midline tongue extension Motor: Right : Upper extremity   5/5    Left:     Upper extremity   5/5  Lower extremity   5/5     Lower extremity   5/5 Tone and  bulk:normal tone throughout; no atrophy noted Sensory: Pinprick and light touch intact throughout, bilaterally Deep Tendon Reflexes: 2+ and symmetric throughout Plantars: Right: downgoing   Left:  downgoing Cerebellar: normal finger-to-nose and normal heel-to-shin test Gait: Unable to test secondary to dizziness CV: pulses palpable throughout     Laboratory Studies:  Basic Metabolic Panel:  Recent Labs Lab 08/07/13 1833  NA 139  K 4.2  CL 103  CO2 21  GLUCOSE 169*  BUN 14  CREATININE 0.62  CALCIUM 9.8    Liver Function Tests:  Recent Labs Lab 08/07/13 1833  AST 27  ALT 19  ALKPHOS 95  BILITOT 0.3  PROT 8.4*  ALBUMIN 4.1    Recent Labs Lab 08/07/13 1833  LIPASE 29   No results found for this basename: AMMONIA,  in the last 168 hours  CBC:  Recent Labs Lab 08/07/13 1833 08/08/13 0518  WBC 8.2 7.5  HGB 14.8 12.7  HCT 45.8 40.0  MCV 79.8 79.1  PLT 197 207    Cardiac Enzymes: No results found for this basename: CKTOTAL, CKMB, CKMBINDEX, TROPONINI,  in the last 168 hours  BNP: No components found with this basename: POCBNP,   CBG: No results found for this basename: GLUCAP,  in the last 168 hours  Microbiology: No results found for this or any previous visit.  Coagulation Studies:  Recent Labs  08/08/13 0518  LABPROT 13.2  INR 1.00    Urinalysis:  Recent Labs Lab 08/07/13 1947  COLORURINE YELLOW  LABSPEC 1.013  PHURINE 7.5  GLUCOSEU 100*  HGBUR TRACE*  BILIRUBINUR NEGATIVE  KETONESUR NEGATIVE  PROTEINUR 30*  UROBILINOGEN 0.2  NITRITE NEGATIVE  LEUKOCYTESUR NEGATIVE    Lipid Panel: No results found for this basename: chol, trig, hdl, cholhdl, vldl, ldlcalc    HgbA1C:  No results found for this basename: HGBA1C    Urine Drug Screen:   No results found for this basename: labopia, cocainscrnur, labbenz, amphetmu, thcu, labbarb    Alcohol Level: No results found for this basename: ETH,  in the last 168 hours   Imaging: Ct Head Wo Contrast  08/07/2013   CLINICAL DATA:  Dizziness, elevated blood pressure  EXAM: CT HEAD WITHOUT CONTRAST  TECHNIQUE: Contiguous axial images were obtained from the base of the skull  through the vertex without intravenous contrast.  COMPARISON:  None.  FINDINGS: There is no evidence of mass effect, midline shift or extra-axial fluid collections. There is no evidence of a space-occupying lesion or intracranial hemorrhage. There is no evidence of a cortical-based area of acute infarction.  The ventricles and sulci are appropriate for the patient's age. The basal cisterns are patent.  Visualized portions of the orbits are unremarkable. The visualized portions of the paranasal sinuses and mastoid air cells are unremarkable.  The osseous structures are unremarkable.  IMPRESSION: Normal CT of the brain without intravenous contrast.   Electronically Signed   By: Kathreen Devoid   On: 08/07/2013 21:51    Assessment: 53 y.o. female presenting with dizziness, N/V.  Neurological examination is nonfocal.  BP was markedly elevated on presentation.  Concern was for stroke versus hypertensive encephalopathy.  Head CT has been reviewed and shows no acute changes.    Stroke Risk Factors - hypertension  Plan: 1. MRI, MRA  of the brain without contrast 2. PT consult, OT consult 3. Prophylactic therapy-Antiplatelet med: Aspirin - dose 81mg  daily 4. Agressive management of nausea and vomiting 5. Telemetry monitoring 6. Frequent neuro checks 7. BP management  Alexis Goodell, MD [facility] [phone] 08/08/2013, 6:26 AM

## 2013-08-09 DIAGNOSIS — I059 Rheumatic mitral valve disease, unspecified: Secondary | ICD-10-CM

## 2013-08-09 DIAGNOSIS — R51 Headache: Secondary | ICD-10-CM

## 2013-08-09 MED ORDER — VALPROATE SODIUM 500 MG/5ML IV SOLN
500.0000 mg | Freq: Three times a day (TID) | INTRAVENOUS | Status: DC
  Administered 2013-08-09 – 2013-08-12 (×9): 500 mg via INTRAVENOUS
  Filled 2013-08-09 (×11): qty 5

## 2013-08-09 MED ORDER — SIMVASTATIN 20 MG PO TABS
20.0000 mg | ORAL_TABLET | Freq: Every day | ORAL | Status: DC
  Administered 2013-08-09 – 2013-08-11 (×3): 20 mg via ORAL
  Filled 2013-08-09 (×3): qty 1

## 2013-08-09 MED ORDER — PERFLUTREN LIPID MICROSPHERE
INTRAVENOUS | Status: AC
  Filled 2013-08-09: qty 10

## 2013-08-09 NOTE — Progress Notes (Signed)
PT Cancellation Note  Patient Details Name: Leslie Graves MRN: [id number] DOB: Sep 27, 1960   Cancelled Treatment:     Pt currently on strict bedrest and will need increased activity order for evaluation.    Lanetta Inch Beth 08/09/2013, 7:11 AM Elwyn Reach, [place]

## 2013-08-09 NOTE — Progress Notes (Signed)
SLP Cancellation Note  Patient Details Name: Leslie Graves MRN: [id number] DOB: 1960-04-06   Cancelled treatment:       Reason Eval/Treat Not Completed: Other (comment) (pt having procedure done currently that has just started per technician, will reattempt another time)   Claudie Fisherman, [place] [facility] SLP [phone]

## 2013-08-09 NOTE — Progress Notes (Signed)
Echocardiogram 2D Echocardiogram has been performed.  Leslie Graves 08/09/2013, 12:17 PM

## 2013-08-09 NOTE — Evaluation (Signed)
Physical Therapy Evaluation Patient Details Name: Leslie Graves MRN: [id number] DOB: 22-Nov-1960 Today's Date: 08/09/2013   History of Present Illness  Pt admitted with dizziness, N/V and MRI brain without contrast revealed foci of abnormal signal affecting the brainstem, cerebellar peduncles and cerebral hemispheric deep white matter including the corpus callosum. There is a 3 mm focus of restricted  with consideration of MS vs CVA  Clinical Impression  Pt very pleasant but fatiguing quickly with activity and gait. Pt was independent and attending zumba and line dancing prior to feeling sick. Pt with brief period of dizziness with gait but no report of spinning and decreased with gait cessation, vision and tracking not assessed at this time. Pt educated for balance and gait deficits as well as plan for therapy. Pt with above and below deficits who will benefit from acute therapy to maximize mobility, function and gait to decrease burden of care.     Follow Up Recommendations Home health PT;Supervision for mobility/OOB    Equipment Recommendations  Rolling walker with 5" wheels    Recommendations for Other Services OT consult     Precautions / Restrictions Precautions Precautions: Fall      Mobility  Bed Mobility Overal bed mobility: Needs Assistance Bed Mobility: Supine to Sit     Supine to sit: Min assist     General bed mobility comments: min hand held assist to fully elevate trunk from surface  Transfers Overall transfer level: Needs assistance   Transfers: Sit to/from Stand Sit to Stand: Supervision         General transfer comment: cues for hand placement from bed and toilet with rail  Ambulation/Gait Ambulation/Gait assistance: Min assist Ambulation Distance (Feet): 15 Feet (x 2 trials with seated rest) Assistive device: 2 person hand held assist;Rolling walker (2 wheeled) Gait Pattern/deviations: Step-through pattern;Decreased stride length;Wide base of  support   Gait velocity interpretation: Below normal speed for age/gender General Gait Details: pt initially walking 5' with bil HHA when stated fatigue and turned to bathroom. After toileting walked out with RW with increased balance but continued short stride with cues for position and use of RW  Stairs            Wheelchair Mobility    Modified Rankin (Stroke Patients Only) Modified Rankin (Stroke Patients Only) Pre-Morbid Rankin Score: No symptoms Modified Rankin: Moderate disability     Balance Overall balance assessment: Needs assistance   Sitting balance-Leahy Scale: Good       Standing balance-Leahy Scale: Fair                               Pertinent Vitals/Pain No pain    Home Living Family/patient expects to be discharged to:: Private residence Living Arrangements: Spouse/significant other Available Help at Discharge: Family;Available 24 hours/day Type of Home: House Home Access: Stairs to enter   [employer] of Steps: 1 Home Layout: One level Home Equipment: None      Prior Function Level of Independence: Independent               Hand Dominance        Extremity/Trunk Assessment   Upper Extremity Assessment: Overall WFL for tasks assessed           Lower Extremity Assessment: Overall WFL for tasks assessed (4/5 bil LE all myotomes with sensation WFL)      Cervical / Trunk Assessment: Normal  Communication   Communication: No difficulties  Cognition Arousal/Alertness: Awake/alert Behavior During Therapy: WFL for tasks assessed/performed Overall Cognitive Status: Within Functional Limits for tasks assessed                      General Comments      Exercises        Assessment/Plan    PT Assessment Patient needs continued PT services  PT Diagnosis Abnormality of gait   PT Problem List Decreased activity tolerance;Decreased balance;Decreased knowledge of use of DME  PT Treatment  Interventions Gait training;DME instruction;Stair training;Functional mobility training;Therapeutic activities;Therapeutic exercise;Patient/family education;Balance training   PT Goals (Current goals can be found in the Care Plan section) Acute Rehab PT Goals Patient Stated Goal: be able to return to line dancing and zumba PT Goal Formulation: With patient/family Time For Goal Achievement: 08/23/13 Potential to Achieve Goals: Good    Frequency Min 3X/week   Barriers to discharge        Co-evaluation               End of Session   Activity Tolerance: Patient tolerated treatment well Patient left: in chair;with call bell/phone within reach;with family/visitor present Nurse Communication: Mobility status         Time: 7517-0017 PT Time Calculation (min): 14 min   Charges:   PT Evaluation $Initial PT Evaluation Tier I: 1 Procedure PT Treatments $Gait Training: 8-22 mins   PT G CodesMelford Aase 08/09/2013, 1:09 PM Elwyn Reach, [place]

## 2013-08-09 NOTE — Progress Notes (Addendum)
NEURO HOSPITALIST PROGRESS NOTE   SUBJECTIVE:                                                                                                                        Complains of HA and nausea. She tells me that she is not a HA person but since last Saturday the HA has been almost constant. Dilaudid helps to ameliorate the HA. The HA is associated with nausea and photophobia. Denies having vertigo now but said that on the day of admission to the hospital she had " spinning sensation" with imbalance, double vision, weak legs and could barely walk. When she arrived to the ED, she was noted to be hypertensive in 200 SBP and had a dysconjugate gaze.   MRI brain without contrast revealed foci of abnormal signal affecting the brainstem, cerebellar peduncles and cerebral hemispheric deep white matter including the corpus callosum. There is a 3 mm focus of restricted  diffusion in the left middle cerebellar peduncle and a few small areas in the hemispheric white matter.  OBJECTIVE:                                                                                                                           Vital signs in last 24 hours: Temp:  [98.4 F (36.9 C)-99.4 F (37.4 C)] 99.4 F (37.4 C) (07/20 1000) Pulse Rate:  [76-85] 80 (07/20 1000) Resp:  [20-22] 20 (07/20 1000) BP: (162-185)/(71-86) 164/72 mmHg (07/20 1000) SpO2:  [96 %-97 %] 97 % (07/20 1000)  Intake/Output from previous day:   Intake/Output this shift:   Nutritional status: Full Liquid  Past Medical History  Diagnosis Date  . Breast cancer   . Hypertension     Neurologic Exam:  General: Mental Status: Alert, oriented, thought content appropriate.  Speech fluent without evidence of aphasia.  Able to follow 3 step commands without difficulty. Cranial Nerves: II: Discs flat bilaterally; Visual fields grossly normal, pupils equal, round, reactive to light and accommodation III,IV, VI:  ptosis not present, extra-ocular motions intact bilaterally V,VII: smile symmetric, facial light touch sensation normal bilaterally VIII: hearing normal bilaterally IX,X: gag reflex present XI: bilateral shoulder shrug XII: midline tongue extension without atrophy or fasciculations  Motor: Right :  Upper extremity   5/5    Left:     Upper extremity   5/5  Lower extremity   5/5     Lower extremity   5/5 Tone and bulk:normal tone throughout; no atrophy noted Sensory: Pinprick and light touch intact throughout, bilaterally Deep Tendon Reflexes:  Right: Upper Extremity   Left: Upper extremity   biceps (C-5 to C-6) 2/4   biceps (C-5 to C-6) 2/4 tricep (C7) 2/4    triceps (C7) 2/4 Brachioradialis (C6) 2/4  Brachioradialis (C6) 2/4  Lower Extremity Lower Extremity  quadriceps (L-2 to L-4) 2/4   quadriceps (L-2 to L-4) 2/4 Achilles (S1) 2/4   Achilles (S1) 2/4  Plantars: Right: downgoing   Left: downgoing Cerebellar: normal finger-to-nose,  normal heel-to-shin test Gait:  No tested    Lab Results: Lab Results  Component Value Date/Time   CHOL 213* 08/08/2013 11:22 AM   Lipid Panel  Recent Labs  08/08/13 1122  CHOL 213*  TRIG 67  HDL 64  CHOLHDL 3.3  VLDL 13  LDLCALC 136*    Studies/Results: Ct Head Wo Contrast  08/07/2013   CLINICAL DATA:  Dizziness, elevated blood pressure  EXAM: CT HEAD WITHOUT CONTRAST  TECHNIQUE: Contiguous axial images were obtained from the base of the skull through the vertex without intravenous contrast.  COMPARISON:  None.  FINDINGS: There is no evidence of mass effect, midline shift or extra-axial fluid collections. There is no evidence of a space-occupying lesion or intracranial hemorrhage. There is no evidence of a cortical-based area of acute infarction.  The ventricles and sulci are appropriate for the patient's age. The basal cisterns are patent.  Visualized portions of the orbits are unremarkable. The visualized portions of the paranasal  sinuses and mastoid air cells are unremarkable.  The osseous structures are unremarkable.  IMPRESSION: Normal CT of the brain without intravenous contrast.   Electronically Signed   By: Kathreen Devoid   On: 08/07/2013 21:51   Mr Brain W Wo Contrast  08/08/2013   CLINICAL DATA:  Nausea and vomiting.  Gaze disturbance.  EXAM: MRI HEAD WITHOUT AND WITH CONTRAST  TECHNIQUE: Multiplanar, multiecho pulse sequences of the brain and surrounding structures were obtained without and with intravenous contrast.  CONTRAST:  85mL MULTIHANCE GADOBENATE DIMEGLUMINE 529 MG/ML IV SOLN  COMPARISON:  Head CT 718  FINDINGS: There are foci of abnormal T2 and FLAIR signal affecting the brainstem, middle cerebellar peduncle on the left, corpus callosum, and hemispheric deep and subcortical white matter. The pattern of involvement is suggestive of multiple sclerosis, but not entirely specific. The findings could be due to small vessel ischemic changes. There is a 3 mm focus of restricted diffusion in the middle cerebellar peduncle on the left. A few of the other hemispheric white matter lesions show a low level restricted diffusion. None of the foci show abnormal contrast enhancement. There is no cortical or large vessel territory insult. No mass lesion, hydrocephalus or extra-axial collection. There are punctate foci of hemosiderin deposition within the brain stem, thalami and scatter within the hemispheric white matter. These could be micro hemorrhages associated with old infarctions or demyelinating lesions or could relate to old closed head injury. No evidence of acute hemorrhage. No hydrocephalus or extra-axial collection. No pituitary mass. No inflammatory sinus disease.  IMPRESSION: Foci of abnormal signal affecting the brainstem, cerebellar peduncles and cerebral hemispheric deep white matter including the corpus callosum. This appearance could be due to multiple sclerosis or small-vessel ischemia. There is a 3  mm focus of  restricted diffusion in the left middle cerebellar peduncle and a few small areas in the hemispheric white matter. If the underlying pathology is multiple sclerosis, these are active lesions. If the underlying pathology is small vessel ischemia, these are acute/ subacute small vessel infarctions. I favor multiple sclerosis in this case. There is, however, the atypical feature of punctate foci of hemosiderin deposition within the brain, more commonly seen with small vessel infarctions than demyelinating disease. Is there history of old head trauma?   Electronically Signed   By: Nelson Chimes M.D.   On: 08/08/2013 17:39    MEDICATIONS                                                                                                                        Scheduled: . aspirin EC  81 mg Oral Daily  . hydrALAZINE  5 mg Intravenous Once  . sodium chloride  3 mL Intravenous Q12H    ASSESSMENT/PLAN:                                                                                                           53 y/o with new onset HA, nausea, vomiting, photophobia, vertigo, unsteadiness, double vision, and legs weakness. Except for HA, those symptoms had resolved and she has a non focal neuro-exam. MRI brain with findings concerning for demyelinating disease versus ischemia. Will review MRI again with neuro-radiologist. If findings are more consistent with demyelinating disease will pursue MRI cervical spine and LP. Trial of IV Depacon for HA. ASA 81 mg daily. Will follow up.  Dorian Pod, MD [facility] [phone]  08/09/2013, 12:15 PM

## 2013-08-09 NOTE — Progress Notes (Signed)
[facility]                                                                              Patient Demographics  Leslie Graves, is a 53 y.o. female, DOB - 05-26-1960, [id number]  Admit date - 08/07/2013   Admitting Physician Sandi Mealy, MD  Outpatient Primary MD for the patient is No primary provider on file.  LOS - 2   Chief Complaint  Patient presents with  . Nausea  . Emesis      HPI on 7/18//2015 Leslie Graves is a 53 y.o AAF who presented to the ER with family. She was alert and cooperative during the encounter with admitting physician. The husband stated that he left her in the morning around 9 am in perfectly good health. Their son and the husband arrived to the house around 4pm and found where she had vomited on herself. The husband also stated she was very weak in her legs and could barely walk. She had eaten some frozen stir fry dinner shortly before the vomiting and they thought this may be why she was vomiting. When she arrived to the ED, she was noted to be hypertensive in 200 SBP and the phenergan and reglan did not seem to help with her vomiting. She continued to vomit bilious substances while in the ED after the medication. Also of note, upon re-examining the patient, the ED MD noted a dysconjugate gaze so a CT of the head without contrast was performed which was negative for any acute pathology. Neurology was consulted and Dr. Doy Mince suggested a MRI of the brain and the patient would need to be transferred to [facility].  Interim history Patient continues to have nausea however no vomiting. Catheter we advanced her diet. Patient MRI noted some questionable demyelinating process with possible MS versus stroke. Pending further recommendations from neurology. Echocardiogram and carotid Doppler also pending.  Assessment & Plan  Dizziness with Nausea/Vomiting, Concern for CVA vs Hypertensive Encephalopathy -Patient was transferred from [facility] long emergency  department to [place], in -CT of the head showed no acute changes -MRI brain: Foci of abnormal signal affecting brain, cerebellar peduncles, deep white matter including the corpus callosum, could be sclerosis or small vessel ischemia. -Pending echocardiogram and carotid Doppler, as well as PT and OT evaluations -LDL 136, hemoglobin A1c 5.8 -Neurology consulted and following -Continue aspirin 81 mg -Will start patient on low dose statin.  Nausea and vomiting -Likely related to the above versus gastroenteritis -She currently afebrile no leukocytosis -Continue supportive care and antibiotics and IVF  Accelerated hypertension -Will continue patient on hydralazine as needed  History of breast cancer -Status post mastectomy and radiation in 1997, currently in remission  Obesity, BMI 119 -Patient will need to discuss with her primary care physician and exercise as well as diet modification program.  Hyperlipidemia -Lipid panel: Total cholesterol 213, triglycerides 67, HDL 64, LDL 136 -Will start low-dose  Code Status: Full   Family Communication: family at bedside  Disposition Plan: Admitted, pending further workup  Time Spent in minutes   25 minutes  Procedures  None  Consults   Neurology   DVT Prophylaxis  SCDs  Lab Results  Component  Value Date   PLT 207 08/08/2013    Medications  Scheduled Meds: . aspirin EC  81 mg Oral Daily  . hydrALAZINE  5 mg Intravenous Once  . sodium chloride  3 mL Intravenous Q12H   Continuous Infusions: . sodium chloride 100 mL/hr at 08/09/13 0245   PRN Meds:.hydrALAZINE, morphine injection, ondansetron (ZOFRAN) IV, promethazine  Antibiotics    Anti-infectives   Start     Dose/Rate Route Frequency Ordered Stop   08/07/13 0000  cephALEXin (KEFLEX) 500 MG capsule  Status:  Discontinued     500 mg Oral 4 times daily 08/07/13 2206 08/07/13    08/07/13 0000  metroNIDAZOLE (FLAGYL) 500 MG tablet  Status:  Discontinued     500 mg Oral 2  times daily 08/07/13 2206 08/07/13       Subjective:   Leslie Graves seen and examined today.  Patient states she is no longer in vomiting at this time, and states she would like to increase her diet. Patient denies any abdominal pain at this time. She also denies any dizziness, shortness of breath, chest.   Objective:   Filed Vitals:   08/08/13 1745 08/08/13 2212 08/09/13 0241 08/09/13 1000  BP: 180/86 185/79 162/76 164/72  Pulse: 76 81 78 80  Temp: 99.1 F (37.3 C) 98.9 F (37.2 C) 99 F (37.2 C) 99.4 F (37.4 C)  TempSrc: Oral Oral Oral Oral  Resp: 22 22 22 20   Height:      Weight:      SpO2: 96% 97%  97%    Wt Readings from Last 3 Encounters:  08/08/13 314.1 kg (692 lb 7.4 oz)    No intake or output data in the 24 hours ending 08/09/13 1020  Exam  General: Well developed, well nourished, NAD, appears stated age  HEENT: NCAT, mucous membranes moist.   Cardiovascular: S1 S2 auscultated, no rubs, murmurs or gallops. Regular rate and rhythm.  Respiratory: Clear to auscultation bilaterally with equal chest rise  Abdomen: Soft, obese, nontender, nondistended, + bowel sounds  Extremities: warm dry without cyanosis clubbing or edema  Neuro: AAOx3, no focal deficits  Skin: Without rashes exudates or nodules  Psych: Normal affect and demeanor with intact judgement and insight  Data Review   Micro Results No results found for this or any previous visit (from the past 240 hour(s)).  Radiology Reports Ct Head Wo Contrast  08/07/2013   CLINICAL DATA:  Dizziness, elevated blood pressure  EXAM: CT HEAD WITHOUT CONTRAST  TECHNIQUE: Contiguous axial images were obtained from the base of the skull through the vertex without intravenous contrast.  COMPARISON:  None.  FINDINGS: There is no evidence of mass effect, midline shift or extra-axial fluid collections. There is no evidence of a space-occupying lesion or intracranial hemorrhage. There is no evidence of a  cortical-based area of acute infarction.  The ventricles and sulci are appropriate for the patient's age. The basal cisterns are patent.  Visualized portions of the orbits are unremarkable. The visualized portions of the paranasal sinuses and mastoid air cells are unremarkable.  The osseous structures are unremarkable.  IMPRESSION: Normal CT of the brain without intravenous contrast.   Electronically Signed   By: Kathreen Devoid   On: 08/07/2013 21:51    CBC  Recent Labs Lab 08/07/13 1833 08/08/13 0518  WBC 8.2 7.5  HGB 14.8 12.7  HCT 45.8 40.0  PLT 197 207  MCV 79.8 79.1  MCH 25.8* 25.1*  MCHC 32.3 31.8  RDW 14.0 14.2  Chemistries   Recent Labs Lab 08/07/13 1833 08/08/13 0518  NA 139 139  K 4.2 4.0  CL 103 101  CO2 21 21  GLUCOSE 169* 117*  BUN 14 10  CREATININE 0.62 0.62  CALCIUM 9.8 9.0  AST 27  --   ALT 19  --   ALKPHOS 95  --   BILITOT 0.3  --    ------------------------------------------------------------------------------------------------------------------ estimated creatinine clearance is 201.2 ml/min (by C-G formula based on Cr of 0.62). ------------------------------------------------------------------------------------------------------------------  Recent Labs  08/08/13 0518  HGBA1C 5.8*   ------------------------------------------------------------------------------------------------------------------  Recent Labs  08/08/13 1122  CHOL 213*  HDL 64  LDLCALC 136*  TRIG 67  CHOLHDL 3.3   ------------------------------------------------------------------------------------------------------------------  Recent Labs  08/08/13 1122  TSH 0.768   ------------------------------------------------------------------------------------------------------------------ No results found for this basename: VITAMINB12, FOLATE, FERRITIN, TIBC, IRON, RETICCTPCT,  in the last 72 hours  Coagulation profile  Recent Labs Lab 08/08/13 0518  INR 1.00    No  results found for this basename: DDIMER,  in the last 72 hours  Cardiac Enzymes  Recent Labs Lab 08/08/13 0518  TROPONINI <0.30   ------------------------------------------------------------------------------------------------------------------ No components found with this basename: POCBNP,     Tauriel Scronce D.O. on 08/09/2013 at 10:20 AM  Between 7am to 7pm - Pager - [phone]  After 7pm go to [email or web address] - password [email or web address]  And look for the night coverage person covering for me after hours  [facility] Office  [phone]

## 2013-08-10 MED ORDER — LISINOPRIL 10 MG PO TABS
10.0000 mg | ORAL_TABLET | Freq: Every day | ORAL | Status: DC
  Administered 2013-08-10: 10 mg via ORAL
  Filled 2013-08-10: qty 1

## 2013-08-10 MED ORDER — HYDROCODONE-ACETAMINOPHEN 5-325 MG PO TABS
1.0000 | ORAL_TABLET | Freq: Four times a day (QID) | ORAL | Status: DC | PRN
  Administered 2013-08-10 – 2013-08-11 (×4): 1 via ORAL
  Filled 2013-08-10: qty 2
  Filled 2013-08-10: qty 1
  Filled 2013-08-10 (×2): qty 2
  Filled 2013-08-10: qty 1

## 2013-08-10 NOTE — Progress Notes (Signed)
VASCULAR LAB PRELIMINARY  PRELIMINARY  PRELIMINARY  PRELIMINARY  Carotid duplex  completed.    Preliminary report:  Bilateral:  1-39% ICA stenosis.  Vertebral artery flow is antegrade.      Deke Tilghman, RVT 08/10/2013, 11:08 AM

## 2013-08-10 NOTE — Progress Notes (Signed)
Speech Language Pathology Treatment: Dysphagia  Patient Details Name: Leslie Graves MRN: [id number] DOB: January 12, 1961 Today's Date: 08/10/2013 Time: 6568-1275 SLP Time Calculation (min): 8 min  Assessment / Plan / Recommendation Clinical Impression  Pt. Seen for dysphagia therapy with thin liquids (pt. Currently on full liquids due to vomiting yesterday).  Pt. Without complaints with swallow and stated she has not had emesis since last night.  No indications of oropharyngeal difficulties.  Recommend upgrade to regular if cleared with MD and thin liquids, pills with liquid.  No follow up ST needed.   HPI HPI: Leslie Graves is a 53 y.o AAF who presented to the ER with family. She was alert and cooperative during the encounter with admitting physician. The husband stated that he left her in the morning around 9 am in perfectly good health. Their son and the husband arrived to the house around 4pm and found where she had vomited on herself. The husband also stated she was very weak in her legs and could barely walk. She had eaten some frozen stir fry dinner shortly before the vomiting and they thought this may be why she was vomiting. When she arrived to the ED, she was noted to be hypertensive in 200 SBP and the phenergan and reglan did not seem to help with her vomiting. She continued to vomit bilious substances while in the ED after the medication. Also of note, upon re-examining the patient, the ED MD noted a dysconjugate gaze so a CT of the head without contrast was performed which was negative for any acute pathology. Neurology was consulted and Dr. Doy Mince suggested a MRI of the brain and the patient would need to be transferred to [facility].   Pertinent Vitals WDL  SLP Plan  Discharge SLP treatment due to (comment)    Recommendations Diet recommendations: Regular;Thin liquid Liquids provided via: Cup;Straw Medication Administration: Whole meds with liquid Supervision: Patient able to self  feed Compensations: Slow rate;Small sips/bites Postural Changes and/or Swallow Maneuvers: Seated upright 90 degrees              Oral Care Recommendations: Oral care BID Follow up Recommendations: None Plan: Discharge SLP treatment due to (comment)    GO     Cranford Mon.Ed [employer] [phone]  08/10/2013

## 2013-08-10 NOTE — Progress Notes (Addendum)
[facility]                                                                              Patient Demographics  Leslie Graves, is a 53 y.o. female, DOB - 07-May-1960, [id number]  Admit date - 08/07/2013   Admitting Physician Sandi Mealy, MD  Outpatient Primary MD for the patient is No primary provider on file.  LOS - 3   Chief Complaint  Patient presents with  . Nausea  . Emesis      HPI on 7/18//2015 Leslie Graves is a 54 y.o AAF who presented to the ER with family. She was alert and cooperative during the encounter with admitting physician. The husband stated that he left her in the morning around 9 am in perfectly good health. Their son and the husband arrived to the house around 4pm and found where she had vomited on herself. The husband also stated she was very weak in her legs and could barely walk. She had eaten some frozen stir fry dinner shortly before the vomiting and they thought this may be why she was vomiting. When she arrived to the ED, she was noted to be hypertensive in 200 SBP and the phenergan and reglan did not seem to help with her vomiting. She continued to vomit bilious substances while in the ED after the medication. Also of note, upon re-examining the patient, the ED MD noted a dysconjugate gaze so a CT of the head without contrast was performed which was negative for any acute pathology. Neurology was consulted and Dr. Doy Mince suggested a MRI of the brain and the patient would need to be transferred to [facility].  Interim history Patient continues to have nausea however no vomiting. Catheter we advanced her diet. Patient MRI noted some questionable demyelinating process with possible MS versus stroke. Pending further recommendations from neurology. Pending MRI of the cervical spine and possible LP.   Assessment & Plan  Dizziness with Nausea/Vomiting, Concern for CVA vs Hypertensive Encephalopathy -Patient was transferred from [facility] long emergency  department to [place], in -CT of the head showed no acute changes -MRI brain: Foci of abnormal signal affecting brain, cerebellar peduncles, deep white matter including the corpus callosum, could be sclerosis or small vessel ischemia. -Echocardiogram EF 55-60% -Carotid doppler Bilateral: 1-39% ICA stenosis. Vertebral artery flow is antegrade.  -LDL 136, hemoglobin A1c 5.8 -PT consulted and recommended home health -Neurology consulted and following, recommended MRI cervical spine and possibly LP, and started patient on Depacon -Continue aspirin 81 mg and statin  Nausea and vomiting -Improving, Likely related to the above versus gastroenteritis -She currently afebrile no leukocytosis -Will advance patient's diet as tolerated  Accelerated hypertension -Will start patient on lisinopril and continue hydralazine as needed  History of breast cancer -Status post mastectomy and radiation in 1997, currently in remission  Obesity, BMI 119 -Patient will need to discuss with her primary care physician and exercise as well as diet modification program.  Hyperlipidemia -Lipid panel: Total cholesterol 213, triglycerides 67, HDL 64, LDL 136 -Continue statin  Code Status: Full   Family Communication: family at bedside  Disposition Plan: Admitted, pending further workup  Time Spent in minutes  25 minutes  Procedures  Echocardiogram Study Conclusions  - Left ventricle: The cavity size was normal. There was moderate concentric hypertrophy. Systolic function was normal. The estimated ejection fraction was in the range of 55% to 60%. Wall motion was normal; there were no regional wall motion abnormalities. - Mitral valve: There was mild regurgitation. - Left atrium: The atrium was mildly dilated.  Carotid Doppler: Bilateral: 1-39% ICA stenosis. Vertebral artery flow is antegrade.   Consults   Neurology   DVT Prophylaxis  SCDs  Lab Results  Component Value Date   PLT 207 08/08/2013     Medications  Scheduled Meds: . aspirin EC  81 mg Oral Daily  . hydrALAZINE  5 mg Intravenous Once  . lisinopril  10 mg Oral Daily  . simvastatin  20 mg Oral q1800  . sodium chloride  3 mL Intravenous Q12H  . valproate sodium  500 mg Intravenous 3 times per day   Continuous Infusions: . sodium chloride 100 mL/hr at 08/09/13 1327   PRN Meds:.hydrALAZINE, morphine injection, ondansetron (ZOFRAN) IV, promethazine  Antibiotics    Anti-infectives   Start     Dose/Rate Route Frequency Ordered Stop   08/07/13 0000  cephALEXin (KEFLEX) 500 MG capsule  Status:  Discontinued     500 mg Oral 4 times daily 08/07/13 2206 08/07/13    08/07/13 0000  metroNIDAZOLE (FLAGYL) 500 MG tablet  Status:  Discontinued     500 mg Oral 2 times daily 08/07/13 2206 08/07/13       Subjective:   Leslie Graves seen and examined today.  Patient states she is no longer in vomiting and states her nausea is improving.  She  denies any abdominal pain at this time. She also denies any dizziness, shortness of breath, chest.   Patient states she has been under a lot of stress at work over the past several weeks.   Objective:   Filed Vitals:   08/09/13 2030 08/10/13 0300 08/10/13 0644 08/10/13 1013  BP: 199/96 164/91 223/77 191/79  Pulse: 92 92 85 78  Temp: 99 F (37.2 C) 99.2 F (37.3 C) 98.9 F (37.2 C) 98.5 F (36.9 C)  TempSrc: Oral Oral Oral Oral  Resp: 18 20 20 20   Height:      Weight:      SpO2: 97% 96% 99% 97%    Wt Readings from Last 3 Encounters:  08/08/13 314.1 kg (692 lb 7.4 oz)     Intake/Output Summary (Last 24 hours) at 08/10/13 1106 Last data filed at 08/09/13 1700  Gross per 24 hour  Intake    360 ml  Output      0 ml  Net    360 ml    Exam  General: Well developed, well nourished, NAD, appears stated age  HEENT: NCAT, mucous membranes moist.   Cardiovascular: S1 S2 auscultated, no rubs, murmurs or gallops. Regular rate and rhythm.  Respiratory: Clear to auscultation  bilaterally with equal chest rise  Abdomen: Soft, obese, nontender, nondistended, + bowel sounds  Extremities: warm dry without cyanosis clubbing or edema  Neuro: AAOx3, no focal deficits  Skin: Without rashes exudates or nodules  Psych: Normal affect and demeanor with intact judgement and insight  Data Review   Micro Results No results found for this or any previous visit (from the past 240 hour(s)).  Radiology Reports Ct Head Wo Contrast  08/07/2013   CLINICAL DATA:  Dizziness, elevated blood pressure  EXAM: CT HEAD WITHOUT CONTRAST  TECHNIQUE: Contiguous  axial images were obtained from the base of the skull through the vertex without intravenous contrast.  COMPARISON:  None.  FINDINGS: There is no evidence of mass effect, midline shift or extra-axial fluid collections. There is no evidence of a space-occupying lesion or intracranial hemorrhage. There is no evidence of a cortical-based area of acute infarction.  The ventricles and sulci are appropriate for the patient's age. The basal cisterns are patent.  Visualized portions of the orbits are unremarkable. The visualized portions of the paranasal sinuses and mastoid air cells are unremarkable.  The osseous structures are unremarkable.  IMPRESSION: Normal CT of the brain without intravenous contrast.   Electronically Signed   By: Kathreen Devoid   On: 08/07/2013 21:51    CBC  Recent Labs Lab 08/07/13 1833 08/08/13 0518  WBC 8.2 7.5  HGB 14.8 12.7  HCT 45.8 40.0  PLT 197 207  MCV 79.8 79.1  MCH 25.8* 25.1*  MCHC 32.3 31.8  RDW 14.0 14.2    Chemistries   Recent Labs Lab 08/07/13 1833 08/08/13 0518  NA 139 139  K 4.2 4.0  CL 103 101  CO2 21 21  GLUCOSE 169* 117*  BUN 14 10  CREATININE 0.62 0.62  CALCIUM 9.8 9.0  AST 27  --   ALT 19  --   ALKPHOS 95  --   BILITOT 0.3  --    ------------------------------------------------------------------------------------------------------------------ estimated creatinine  clearance is 201.2 ml/min (by C-G formula based on Cr of 0.62). ------------------------------------------------------------------------------------------------------------------  Recent Labs  08/08/13 0518  HGBA1C 5.8*   ------------------------------------------------------------------------------------------------------------------  Recent Labs  08/08/13 1122  CHOL 213*  HDL 64  LDLCALC 136*  TRIG 67  CHOLHDL 3.3   ------------------------------------------------------------------------------------------------------------------  Recent Labs  08/08/13 1122  TSH 0.768   ------------------------------------------------------------------------------------------------------------------ No results found for this basename: VITAMINB12, FOLATE, FERRITIN, TIBC, IRON, RETICCTPCT,  in the last 72 hours  Coagulation profile  Recent Labs Lab 08/08/13 0518  INR 1.00    No results found for this basename: DDIMER,  in the last 72 hours  Cardiac Enzymes  Recent Labs Lab 08/08/13 0518  TROPONINI <0.30   ------------------------------------------------------------------------------------------------------------------ No components found with this basename: POCBNP,     Joshva Labreck D.O. on 08/10/2013 at 11:06 AM  Between 7am to 7pm - Pager - [phone]  After 7pm go to [email or web address] - password [email or web address]  And look for the night coverage person covering for me after hours  [facility] Office  [phone]

## 2013-08-11 ENCOUNTER — Inpatient Hospital Stay (HOSPITAL_COMMUNITY): Payer: BC Managed Care – PPO

## 2013-08-11 LAB — COMPREHENSIVE METABOLIC PANEL
ALT: 22 U/L (ref 0–35)
ANION GAP: 14 (ref 5–15)
AST: 26 U/L (ref 0–37)
Albumin: 3.1 g/dL — ABNORMAL LOW (ref 3.5–5.2)
Alkaline Phosphatase: 69 U/L (ref 39–117)
BUN: 8 mg/dL (ref 6–23)
CO2: 22 mEq/L (ref 19–32)
Calcium: 9.1 mg/dL (ref 8.4–10.5)
Chloride: 105 mEq/L (ref 96–112)
Creatinine, Ser: 0.67 mg/dL (ref 0.50–1.10)
GFR calc Af Amer: >90 mL/min (ref >90)
GFR calc non Af Amer: >90 mL/min (ref >90)
Glucose, Bld: 108 mg/dL — ABNORMAL HIGH (ref 70–99)
Potassium: 3.8 mEq/L (ref 3.7–5.3)
Sodium: 141 mEq/L (ref 137–147)
TOTAL PROTEIN: 6.7 g/dL (ref 6.0–8.3)
Total Bilirubin: 0.7 mg/dL (ref 0.3–1.2)

## 2013-08-11 LAB — CBC
HEMATOCRIT: 43.7 % (ref 36.0–46.0)
Hemoglobin: 13.9 g/dL (ref 12.0–15.0)
MCH: 25.8 pg — ABNORMAL LOW (ref 26.0–34.0)
MCHC: 31.8 g/dL (ref 30.0–36.0)
MCV: 81.1 fL (ref 78.0–100.0)
PLATELETS: 175 10*3/uL (ref 150–400)
RBC: 5.39 MIL/uL — ABNORMAL HIGH (ref 3.87–5.11)
RDW: 14.5 % (ref 11.5–15.5)
WBC: 6.1 10*3/uL (ref 4.0–10.5)

## 2013-08-11 MED ORDER — LISINOPRIL 20 MG PO TABS
20.0000 mg | ORAL_TABLET | Freq: Every day | ORAL | Status: DC
  Administered 2013-08-11 – 2013-08-12 (×2): 20 mg via ORAL
  Filled 2013-08-11 (×2): qty 1

## 2013-08-11 MED ORDER — HYDROCHLOROTHIAZIDE 25 MG PO TABS
25.0000 mg | ORAL_TABLET | Freq: Every day | ORAL | Status: DC
  Administered 2013-08-11 – 2013-08-12 (×2): 25 mg via ORAL
  Filled 2013-08-11 (×2): qty 1

## 2013-08-11 MED ORDER — GADOBENATE DIMEGLUMINE 529 MG/ML IV SOLN
20.0000 mL | Freq: Once | INTRAVENOUS | Status: AC | PRN
  Administered 2013-08-11: 20 mL via INTRAVENOUS

## 2013-08-11 NOTE — Progress Notes (Signed)
Subjective: Patient has been feeling fine.  This AM she had labs drawn which she feels increased her BP and now has a mild HA. No other complaints.   Recent BP 207/85, 189/83  Objective: Current vital signs: BP 189/83  Pulse 82  Temp(Src) 98.6 F (37 C) (Oral)  Resp 18  Ht 5\' 4"  (1.626 m)  Wt 314.1 kg (692 lb 7.4 oz)  BMI 118.80 kg/m2  SpO2 97% Vital signs in last 24 hours: Temp:  [98.4 F (36.9 C)-99.5 F (37.5 C)] 98.6 F (37 C) (07/22 1610) Pulse Rate:  [74-83] 82 (07/22 0607) Resp:  [18] 18 (07/22 0607) BP: (167-220)/(65-91) 189/83 mmHg (07/22 0607) SpO2:  [97 %-99 %] 97 % (07/22 0607)  Intake/Output from previous day:   Intake/Output this shift:   Nutritional status: Cardiac  Neurologic Exam: General: NAD Mental Status: Alert, oriented, thought content appropriate.  Speech fluent without evidence of aphasia.  Able to follow 3 step commands without difficulty. Cranial Nerves: II: Visual fields grossly normal, pupils equal, round, reactive to light and accommodation III,IV, VI: ptosis not present, extra-ocular motions intact bilaterally V,VII: smile symmetric, facial light touch sensation normal bilaterally VIII: hearing normal bilaterally IX,X: gag reflex present XI: bilateral shoulder shrug XII: midline tongue extension without atrophy or fasciculations  Motor: Right : Upper extremity   5/5    Left:     Upper extremity   5/5  Lower extremity   5/5     Lower extremity   5/5 Tone and bulk:normal tone throughout; no atrophy noted Sensory: Pinprick and light touch intact throughout, bilaterally Deep Tendon Reflexes:  Right: Upper Extremity   Left: Upper extremity   biceps (C-5 to C-6) 2/4   biceps (C-5 to C-6) 2/4 tricep (C7) 2/4    triceps (C7) 2/4 Brachioradialis (C6) 2/4  Brachioradialis (C6) 2/4  Lower Extremity Lower Extremity  quadriceps (L-2 to L-4) 1/4   quadriceps (L-2 to L-4) 1/4 Achilles (S1) 1/4   Achilles (S1) 1/4  Plantars: Right:  downgoing   Left: downgoing   Lab Results: Basic Metabolic Panel:  Recent Labs Lab 08/07/13 1833 08/08/13 0518 08/11/13 0924  NA 139 139 141  K 4.2 4.0 3.8  CL 103 101 105  CO2 21 21 22   GLUCOSE 169* 117* 108*  BUN 14 10 8   CREATININE 0.62 0.62 0.67  CALCIUM 9.8 9.0 9.1    Liver Function Tests:  Recent Labs Lab 08/07/13 1833 08/11/13 0924  AST 27 26  ALT 19 22  ALKPHOS 95 69  BILITOT 0.3 0.7  PROT 8.4* 6.7  ALBUMIN 4.1 3.1*    Recent Labs Lab 08/07/13 1833  LIPASE 29   No results found for this basename: AMMONIA,  in the last 168 hours  CBC:  Recent Labs Lab 08/07/13 1833 08/08/13 0518 08/11/13 0920  WBC 8.2 7.5 6.1  HGB 14.8 12.7 13.9  HCT 45.8 40.0 43.7  MCV 79.8 79.1 81.1  PLT 197 207 175    Cardiac Enzymes:  Recent Labs Lab 08/08/13 0518  TROPONINI <0.30    Lipid Panel:  Recent Labs Lab 08/08/13 1122  CHOL 213*  TRIG 67  HDL 64  CHOLHDL 3.3  VLDL 13  LDLCALC 136*    CBG: No results found for this basename: GLUCAP,  in the last 168 hours  Microbiology: No results found for this or any previous visit.  Coagulation Studies: No results found for this basename: LABPROT, INR,  in the last 72 hours  Imaging: No results  found.  Medications:  Scheduled: . aspirin EC  81 mg Oral Daily  . hydrALAZINE  5 mg Intravenous Once  . hydrochlorothiazide  25 mg Oral Daily  . lisinopril  20 mg Oral Daily  . simvastatin  20 mg Oral q1800  . sodium chloride  3 mL Intravenous Q12H  . valproate sodium  500 mg Intravenous 3 times per day    Assessment/Plan:  53 y/o with new onset HA, nausea, vomiting, photophobia, vertigo, unsteadiness, double vision, and legs weakness. All symptoms have resolved with exception of return of HA this am.  BP was elevated 207/83. MRI shows foci of abnormal signal affecting the brainstem, cerebellar peduncles and cerebral hemispheric deep white matter including the corpus callosum. This appearance could be  due to multiple sclerosis or small-vessel ischemia. Radiology is favoring MS. Awaiting MRI of cervical spine.   Will continue to follow MRI results.    Etta Quill PA-C [facility] [phone]  08/11/2013, 10:40 AM

## 2013-08-11 NOTE — Progress Notes (Signed)
[facility] PROGRESS NOTE  Leslie Graves JWJ:[id number] DOB: 1960/04/15 DOA: 08/07/2013 PCP: No primary provider on file.  Assessment/Plan: Active Problems:   N&V (nausea and vomiting)   Hypertensive urgency   Dysconjugate gaze   Nausea and vomiting    HPI on 7/18//2015  Leslie Graves is a 53 y.o AAF who presented to the ER with family. She was alert and cooperative during the encounter with admitting physician. The husband stated that he left her in the morning around 9 am in perfectly good health. Their son and the husband arrived to the house around 4pm and found where she had vomited on herself. The husband also stated she was very weak in her legs and could barely walk. She had eaten some frozen stir fry dinner shortly before the vomiting and they thought this may be why she was vomiting. When she arrived to the ED, she was noted to be hypertensive in 200 SBP and the phenergan and reglan did not seem to help with her vomiting. She continued to vomit bilious substances while in the ED after the medication. Also of note, upon re-examining the patient, the ED MD noted a dysconjugate gaze so a CT of the head without contrast was performed which was negative for any acute pathology. Neurology was consulted and Dr. Doy Mince suggested a MRI of the brain and the patient would need to be transferred to [facility].  Interim history  Patient continues to have nausea however no vomiting. Catheter we advanced her diet. Patient MRI noted some questionable demyelinating process with possible MS versus stroke. Pending further recommendations from neurology. Pending MRI of the cervical spine and possible LP.    Assessment & Plan   Dizziness with Nausea/Vomiting, Concern for CVA vs Hypertensive Encephalopathy  -Patient was transferred from [facility] long emergency department to [place], in  -CT of the head showed no acute changes  -MRI brain: Foci of abnormal signal affecting brain, cerebellar peduncles, deep  white matter including the corpus callosum, could be sclerosis or small vessel ischemia.  -Echocardiogram EF 55-60%  -Carotid doppler Bilateral: 1-39% ICA stenosis. Vertebral artery flow is antegrade.  -LDL 136, hemoglobin A1c 5.8  -PT consulted and recommended home health  -Neurology consulted and following, recommended MRI cervical spine and possibly LP, and started patient on Depacon for headache  -Continue aspirin 81 mg and statin   Nausea and vomiting  -Improving, Likely related to the above versus gastroenteritis  -She currently afebrile no leukocytosis  -Will advance patient's diet as tolerated   Accelerated hypertension  -Will start patient on lisinopril and continue hydralazine as needed  Increase lisinopril to 20 mg, started hydrochlorothiazide, no prior history of hypertension   History of breast cancer  -Status post mastectomy and radiation in 1997, currently in remission  Obesity, BMI 119  -Patient will need to discuss with her primary care physician and exercise as well as diet modification program.  Hyperlipidemia  -Lipid panel: Total cholesterol 213, triglycerides 67, HDL 64, LDL 136  -Continue statin    Code Status: Full  Family Communication: family at bedside  Disposition Plan: Possible DC tomorrow   Time Spent in minutes 25 minutes  Procedures  Echocardiogram  Study Conclusions  - Left ventricle: The cavity size was normal. There was moderate concentric hypertrophy. Systolic function was normal. The estimated ejection fraction was in the range of 55% to 60%. Wall motion was normal; there were no regional wall motion abnormalities. - Mitral valve: There was mild regurgitation. - Left atrium: The atrium  was mildly dilated.  Carotid Doppler: Bilateral: 1-39% ICA stenosis. Vertebral artery flow is antegrade.  Consults  Neurology  DVT Prophylaxis SCDs   HPI/Subjective: Still has a mild headache, hypertensive this morning  Objective: Filed Vitals:    08/10/13 2130 08/11/13 0218 08/11/13 0607 08/11/13 1019  BP: 167/65 207/85 189/83 204/89  Pulse: 83 76 82 78  Temp: 99.3 F (37.4 C) 98.4 F (36.9 C) 98.6 F (37 C) 98.4 F (36.9 C)  TempSrc: Oral Oral Oral Oral  Resp: 18 18 18 18   Height:      Weight:      SpO2: 99% 98% 97% 96%   No intake or output data in the 24 hours ending 08/11/13 1107  Exam:  General: Well developed, well nourished, NAD, appears stated age  HEENT: NCAT, mucous membranes moist.  Cardiovascular: S1 S2 auscultated, no rubs, murmurs or gallops. Regular rate and rhythm.  Respiratory: Clear to auscultation bilaterally with equal chest rise  Abdomen: Soft, obese, nontender, nondistended, + bowel sounds  Extremities: warm dry without cyanosis clubbing or edema  Neuro: AAOx3, no focal deficits  Skin: Without rashes exudates or nodules  Psych: Normal affect and demeanor with intact judgement and insight    Data Reviewed: Basic Metabolic Panel:  Recent Labs Lab 08/07/13 1833 08/08/13 0518 08/11/13 0924  NA 139 139 141  K 4.2 4.0 3.8  CL 103 101 105  CO2 21 21 22   GLUCOSE 169* 117* 108*  BUN 14 10 8   CREATININE 0.62 0.62 0.67  CALCIUM 9.8 9.0 9.1    Liver Function Tests:  Recent Labs Lab 08/07/13 1833 08/11/13 0924  AST 27 26  ALT 19 22  ALKPHOS 95 69  BILITOT 0.3 0.7  PROT 8.4* 6.7  ALBUMIN 4.1 3.1*    Recent Labs Lab 08/07/13 1833  LIPASE 29   No results found for this basename: AMMONIA,  in the last 168 hours  CBC:  Recent Labs Lab 08/07/13 1833 08/08/13 0518 08/11/13 0920  WBC 8.2 7.5 6.1  HGB 14.8 12.7 13.9  HCT 45.8 40.0 43.7  MCV 79.8 79.1 81.1  PLT 197 207 175    Cardiac Enzymes:  Recent Labs Lab 08/08/13 0518  TROPONINI <0.30   BNP (last 3 results) No results found for this basename: PROBNP,  in the last 8760 hours   CBG: No results found for this basename: GLUCAP,  in the last 168 hours  No results found for this or any previous visit (from the past  240 hour(s)).   Studies: Ct Head Wo Contrast  08/07/2013   CLINICAL DATA:  Dizziness, elevated blood pressure  EXAM: CT HEAD WITHOUT CONTRAST  TECHNIQUE: Contiguous axial images were obtained from the base of the skull through the vertex without intravenous contrast.  COMPARISON:  None.  FINDINGS: There is no evidence of mass effect, midline shift or extra-axial fluid collections. There is no evidence of a space-occupying lesion or intracranial hemorrhage. There is no evidence of a cortical-based area of acute infarction.  The ventricles and sulci are appropriate for the patient's age. The basal cisterns are patent.  Visualized portions of the orbits are unremarkable. The visualized portions of the paranasal sinuses and mastoid air cells are unremarkable.  The osseous structures are unremarkable.  IMPRESSION: Normal CT of the brain without intravenous contrast.   Electronically Signed   By: Kathreen Devoid   On: 08/07/2013 21:51   Mr Brain W Wo Contrast  08/08/2013   CLINICAL DATA:  Nausea and vomiting.  Gaze disturbance.  EXAM: MRI HEAD WITHOUT AND WITH CONTRAST  TECHNIQUE: Multiplanar, multiecho pulse sequences of the brain and surrounding structures were obtained without and with intravenous contrast.  CONTRAST:  108mL MULTIHANCE GADOBENATE DIMEGLUMINE 529 MG/ML IV SOLN  COMPARISON:  Head CT 718  FINDINGS: There are foci of abnormal T2 and FLAIR signal affecting the brainstem, middle cerebellar peduncle on the left, corpus callosum, and hemispheric deep and subcortical white matter. The pattern of involvement is suggestive of multiple sclerosis, but not entirely specific. The findings could be due to small vessel ischemic changes. There is a 3 mm focus of restricted diffusion in the middle cerebellar peduncle on the left. A few of the other hemispheric white matter lesions show a low level restricted diffusion. None of the foci show abnormal contrast enhancement. There is no cortical or large vessel territory  insult. No mass lesion, hydrocephalus or extra-axial collection. There are punctate foci of hemosiderin deposition within the brain stem, thalami and scatter within the hemispheric white matter. These could be micro hemorrhages associated with old infarctions or demyelinating lesions or could relate to old closed head injury. No evidence of acute hemorrhage. No hydrocephalus or extra-axial collection. No pituitary mass. No inflammatory sinus disease.  IMPRESSION: Foci of abnormal signal affecting the brainstem, cerebellar peduncles and cerebral hemispheric deep white matter including the corpus callosum. This appearance could be due to multiple sclerosis or small-vessel ischemia. There is a 3 mm focus of restricted diffusion in the left middle cerebellar peduncle and a few small areas in the hemispheric white matter. If the underlying pathology is multiple sclerosis, these are active lesions. If the underlying pathology is small vessel ischemia, these are acute/ subacute small vessel infarctions. I favor multiple sclerosis in this case. There is, however, the atypical feature of punctate foci of hemosiderin deposition within the brain, more commonly seen with small vessel infarctions than demyelinating disease. Is there history of old head trauma?   Electronically Signed   By: Nelson Chimes M.D.   On: 08/08/2013 17:39    Scheduled Meds: . aspirin EC  81 mg Oral Daily  . hydrALAZINE  5 mg Intravenous Once  . hydrochlorothiazide  25 mg Oral Daily  . lisinopril  20 mg Oral Daily  . simvastatin  20 mg Oral q1800  . sodium chloride  3 mL Intravenous Q12H  . valproate sodium  500 mg Intravenous 3 times per day   Continuous Infusions: . sodium chloride 100 mL/hr at 08/10/13 1558    Active Problems:   N&V (nausea and vomiting)   Hypertensive urgency   Dysconjugate gaze   Nausea and vomiting    Time spent: 40 minutes   [facility] Pager [phone]. If 8PM-8AM, please contact  night-coverage at [email or web address], password [facility] 08/11/2013, 11:07 AM  LOS: 4 days

## 2013-08-11 NOTE — Progress Notes (Signed)
Physical Therapy Treatment Patient Details Name: Leslie Graves MRN: [id number] DOB: 1960/08/10 Today's Date: 08/11/2013    History of Present Illness Pt admitted with dizziness, N/V and MRI brain without contrast revealed foci of abnormal signal affecting the brainstem, cerebellar peduncles and cerebral hemispheric deep white matter including the corpus callosum. There is a 3 mm focus of restricted  with consideration of MS vs CVA    PT Comments    Pt progressing with mobility. Able to increase mobility distance today at min guard (A), primarily for RW management. Pt reports she feels more steady with RW and is agreeable to ambulate with RW upon D/C. BP in sitting prior to mobilization 177/91; after mobilization 194/83; pt denies any symptoms at this time. Will cont to follow per POC to maximize independence prior to D/C.  Follow Up Recommendations  Home health PT;Supervision for mobility/OOB     Equipment Recommendations  Rolling walker with 5" wheels    Recommendations for Other Services       Precautions / Restrictions Precautions Precautions: None Precaution Comments: pt denies any falls PTA Restrictions Weight Bearing Restrictions: No    Mobility  Bed Mobility               General bed mobility comments: not addressed; pt up in chair and returned to chair  Transfers Overall transfer level: Needs assistance Equipment used: Rolling walker (2 wheeled) Transfers: Sit to/from Stand Sit to Stand: Supervision         General transfer comment: supervision for cues for hand placement and safety with transfer   Ambulation/Gait Ambulation/Gait assistance: Min guard Ambulation Distance (Feet): 70 Feet Assistive device: Rolling walker (2 wheeled) Gait Pattern/deviations: Step-through pattern;Decreased stride length;Trunk flexed;Wide base of support Gait velocity: decr due to fatigue Gait velocity interpretation: Below normal speed for age/gender General Gait Details:  cues for safety and management of RW; min guard to steady RW primarily with directional changes and managing around obstacles; pt motivated to increase mobility distance today   Stairs            Wheelchair Mobility    Modified Rankin (Stroke Patients Only) Modified Rankin (Stroke Patients Only) Pre-Morbid Rankin Score: No symptoms Modified Rankin: Moderately severe disability     Balance Overall balance assessment: Needs assistance         Standing balance support: During functional activity;No upper extremity supported Standing balance-Leahy Scale: Fair Standing balance comment: was able to stand at recliner without RW support                     Cognition Arousal/Alertness: Awake/alert Behavior During Therapy: WFL for tasks assessed/performed Overall Cognitive Status: Within Functional Limits for tasks assessed                      Exercises      General Comments        Pertinent Vitals/Pain See impression for BP; no c/o pain     Home Living                      Prior Function            PT Goals (current goals can now be found in the care plan section) Acute Rehab PT Goals Patient Stated Goal: be able to return to line dancing and zumba PT Goal Formulation: With patient/family Time For Goal Achievement: 08/23/13 Potential to Achieve Goals: Good Progress towards PT goals: Progressing toward goals  Frequency  Min 3X/week    PT Plan Current plan remains appropriate    Co-evaluation             End of Session   Activity Tolerance: Patient tolerated treatment well Patient left: in chair;with call bell/phone within reach;with chair alarm set     Time: 9163-8466 PT Time Calculation (min): 16 min  Charges:  $Gait Training: 8-22 mins                    G Codes:      Elie Confer [place], [place] 08/11/2013, 2:54 PM

## 2013-08-11 NOTE — Progress Notes (Signed)
UR complete.  Jamilee Lafosse RN, MSN 

## 2013-08-12 DIAGNOSIS — R110 Nausea: Secondary | ICD-10-CM

## 2013-08-12 LAB — CBC
HCT: 41.7 % (ref 36.0–46.0)
Hemoglobin: 13 g/dL (ref 12.0–15.0)
MCH: 25 pg — AB (ref 26.0–34.0)
MCHC: 31.2 g/dL (ref 30.0–36.0)
MCV: 80.3 fL (ref 78.0–100.0)
PLATELETS: 169 10*3/uL (ref 150–400)
RBC: 5.19 MIL/uL — ABNORMAL HIGH (ref 3.87–5.11)
RDW: 14.6 % (ref 11.5–15.5)
WBC: 6.1 10*3/uL (ref 4.0–10.5)

## 2013-08-12 LAB — COMPREHENSIVE METABOLIC PANEL
ALT: 17 U/L (ref 0–35)
AST: 17 U/L (ref 0–37)
Albumin: 3 g/dL — ABNORMAL LOW (ref 3.5–5.2)
Alkaline Phosphatase: 64 U/L (ref 39–117)
Anion gap: 13 (ref 5–15)
BILIRUBIN TOTAL: 0.7 mg/dL (ref 0.3–1.2)
BUN: 8 mg/dL (ref 6–23)
CHLORIDE: 103 meq/L (ref 96–112)
CO2: 24 meq/L (ref 19–32)
CREATININE: 0.79 mg/dL (ref 0.50–1.10)
Calcium: 8.8 mg/dL (ref 8.4–10.5)
GLUCOSE: 110 mg/dL — AB (ref 70–99)
Potassium: 3.6 mEq/L — ABNORMAL LOW (ref 3.7–5.3)
SODIUM: 140 meq/L (ref 137–147)
Total Protein: 6.1 g/dL (ref 6.0–8.3)

## 2013-08-12 MED ORDER — ASPIRIN 81 MG PO TBEC: 81.0000 mg | DELAYED_RELEASE_TABLET | Freq: Every day | ORAL | Status: DC

## 2013-08-12 MED ORDER — SIMVASTATIN 20 MG PO TABS: 20.0000 mg | ORAL_TABLET | Freq: Every day | ORAL | Status: DC

## 2013-08-12 MED ORDER — SIMETHICONE 80 MG PO CHEW
80.0000 mg | CHEWABLE_TABLET | Freq: Four times a day (QID) | ORAL | Status: DC | PRN
  Administered 2013-08-12: 80 mg via ORAL
  Filled 2013-08-12: qty 1

## 2013-08-12 MED ORDER — LISINOPRIL 20 MG PO TABS: 20.0000 mg | ORAL_TABLET | Freq: Every day | ORAL | Status: DC

## 2013-08-12 MED ORDER — DIVALPROEX SODIUM 500 MG PO DR TAB
500.0000 mg | DELAYED_RELEASE_TABLET | Freq: Three times a day (TID) | ORAL | Status: DC
  Administered 2013-08-12: 500 mg via ORAL
  Filled 2013-08-12: qty 1

## 2013-08-12 MED ORDER — HYDROCHLOROTHIAZIDE 25 MG PO TABS: 25.0000 mg | ORAL_TABLET | Freq: Every day | ORAL | Status: DC

## 2013-08-12 NOTE — Progress Notes (Signed)
D/C orders received. Pt and significant other educated on d/c instructions. Pt verbalized understanding. Pt handed d/c packet. IV and tele removed. Home equipment rolling walker brought to room. Pt taken downstairs by staff via wheelchair.

## 2013-08-12 NOTE — Care Management Note (Addendum)
  Page 2 of 2   08/12/2013     11:31:34 AM CARE MANAGEMENT NOTE 08/12/2013  Patient:  Leslie Graves, Leslie Graves   Account Number:  [id number]  Date Initiated:  08/11/2013  Documentation initiated by:  Lorne Skeens  Subjective/Objective Assessment:   Patient admitted with nausea/vomiting, bilateral lower extremity weakness. CVA and MS work-up underway. Lives at home with spouse     Action/Plan:   Will follow for discharge needs pending PT/OT evals and physician.   Anticipated DC Date:  08/12/2013   Anticipated DC Plan:  [place]  CM consult      Choice offered to / List presented to:  C-1 Patient   DME arranged  Vassie Moselle      DME agency  [place] arranged  [place].   Status of service:  Completed, signed off Medicare Important Message given?   (If response is "NO", the following Medicare IM given date fields will be blank) Date Medicare IM given:   Medicare IM given by:   Date Additional Medicare IM given:   Additional Medicare IM given by:    Discharge Disposition:  [place]  Per UR Regulation:  Reviewed for med. necessity/level of care/duration of stay  If discussed at [place] of Stay Meetings, dates discussed:    Comments:  08/12/13  [place], MSN, CM- Recieved return call from [place] with [facility] accepting the referral. Patient prefers that her husband Vicente Males be contacted for appointments [phone].  Address is correct in the chart.   08/12/13 [place], MSN, CM- Met with patient and husband to discuss home health at discharge.  Patient is agreeable and has chosen [facility] for HHPT/OT. Voicemail was left for [place] with [facility] to make referral for discharge home today. [facility] was notified of need for rolling walker prior to discharge. Patient states she does not currently have a PCP.  Patient  was provided with the number for [facility] to assist with obtaining a PCP and the importance of doing so as soon as possible was discussed.  Patient states that she intends to see if her husband's PCP at [facility] would be able to accept her as a patient.

## 2013-08-12 NOTE — Progress Notes (Signed)
Subjective: Patient has no complaints. No HA, Blurred vision. Desires to go home.   Objective: Current vital signs: BP 164/62  Pulse 84  Temp(Src) 98.1 F (36.7 C) (Oral)  Resp 20  Ht 5\' 4"  (1.626 m)  Wt 314.1 kg (692 lb 7.4 oz)  BMI 118.80 kg/m2  SpO2 99% Vital signs in last 24 hours: Temp:  [98.1 F (36.7 C)-99.4 F (37.4 C)] 98.1 F (36.7 C) (07/23 1012) Pulse Rate:  [73-93] 84 (07/23 1012) Resp:  [18-20] 20 (07/23 1012) BP: (119-196)/(48-84) 164/62 mmHg (07/23 1012) SpO2:  [95 %-99 %] 99 % (07/23 1012)  Intake/Output from previous day: 07/22 0701 - 07/23 0700 In: 3 [I.V.:3] Out: -  Intake/Output this shift:   Nutritional status: Cardiac  Neurologic Exam: General: NAD Mental Status: Alert, oriented, thought content appropriate.  Speech fluent without evidence of aphasia.  Able to follow 3 step commands without difficulty. Cranial Nerves: II: Visual fields grossly normal, pupils equal, round, reactive to light and accommodation III,IV, VI: ptosis not present, extra-ocular motions intact bilaterally V,VII: smile symmetric, facial light touch sensation normal bilaterally VIII: hearing normal bilaterally IX,X: gag reflex present XI: bilateral shoulder shrug XII: midline tongue extension without atrophy or fasciculations  Motor: Right : Upper extremity   5/5    Left:     Upper extremity   5/5  Lower extremity   5/5     Lower extremity   5/5 Tone and bulk:normal tone throughout; no atrophy noted Sensory: Pinprick and light touch intact throughout, bilaterally Deep Tendon Reflexes:  1+ throughout  Plantars: Right: downgoing   Left: downgoing Cerebellar: normal finger-to-nose,  normal heel-to-shin test    Lab Results: Basic Metabolic Panel:  Recent Labs Lab 08/07/13 1833 08/08/13 0518 08/11/13 0924 08/12/13 0537  NA 139 139 141 140  K 4.2 4.0 3.8 3.6*  CL 103 101 105 103  CO2 21 21 22 24   GLUCOSE 169* 117* 108* 110*  BUN 14 10 8 8   CREATININE 0.62  0.62 0.67 0.79  CALCIUM 9.8 9.0 9.1 8.8    Liver Function Tests:  Recent Labs Lab 08/07/13 1833 08/11/13 0924 08/12/13 0537  AST 27 26 17   ALT 19 22 17   ALKPHOS 95 69 64  BILITOT 0.3 0.7 0.7  PROT 8.4* 6.7 6.1  ALBUMIN 4.1 3.1* 3.0*    Recent Labs Lab 08/07/13 1833  LIPASE 29   No results found for this basename: AMMONIA,  in the last 168 hours  CBC:  Recent Labs Lab 08/07/13 1833 08/08/13 0518 08/11/13 0920 08/12/13 0537  WBC 8.2 7.5 6.1 6.1  HGB 14.8 12.7 13.9 13.0  HCT 45.8 40.0 43.7 41.7  MCV 79.8 79.1 81.1 80.3  PLT 197 207 175 169    Cardiac Enzymes:  Recent Labs Lab 08/08/13 0518  TROPONINI <0.30    Lipid Panel:  Recent Labs Lab 08/08/13 1122  CHOL 213*  TRIG 67  HDL 64  CHOLHDL 3.3  VLDL 13  LDLCALC 136*    CBG: No results found for this basename: GLUCAP,  in the last 168 hours  Microbiology: No results found for this or any previous visit.  Coagulation Studies: No results found for this basename: LABPROT, INR,  in the last 72 hours  Imaging: Mr Cervical Spine W Wo Contrast  08/11/2013   CLINICAL DATA:  New onset headache. Nausea, vomiting and photophobia. Assess for multiple sclerosis.  EXAM: MRI CERVICAL SPINE WITHOUT AND WITH CONTRAST  TECHNIQUE: Multiplanar and multiecho pulse sequences of the  cervical spine, to include the craniocervical junction and cervicothoracic junction, were obtained according to standard protocol without and with intravenous contrast.  CONTRAST:  75mL MULTIHANCE GADOBENATE DIMEGLUMINE 529 MG/ML IV SOLN  COMPARISON:  MRI brain 08/08/2013  FINDINGS: The cervical spinal cord appears normal. No evidence of demyelinating disease in this region. No abnormal contrast enhancement.  There is straightening and slight kyphotic curvature of the spine. There is degenerative spondylosis with non-compressive disc bulges at C3-4, C4-5, C5-6 and C6-7. No foraminal stenosis from C3-4 through C5-6. At C6-7, there is foraminal  narrowing on the left because of osteophytic encroachment.  C7-T1 shows mild facet degeneration but no stenosis.  IMPRESSION: No cord abnormality to suggest multiple sclerosis involvement in this region.  Cervical spondylosis. No central canal stenosis. Foraminal narrowing on the left at C6-7 because of osteophytic encroachment.   Electronically Signed   By: Nelson Chimes M.D.   On: 08/11/2013 15:34    Medications:  Scheduled: . aspirin EC  81 mg Oral Daily  . divalproex  500 mg Oral 3 times per day  . hydrALAZINE  5 mg Intravenous Once  . hydrochlorothiazide  25 mg Oral Daily  . lisinopril  20 mg Oral Daily  . simvastatin  20 mg Oral q1800  . sodium chloride  3 mL Intravenous Q12H    Assessment/Plan:  53 y/o with new onset HA, nausea, vomiting, photophobia, vertigo, unsteadiness, double vision, and legs weakness. All symptoms have resolved. MRI C-spine (-).  MRI shows foci of abnormal signal affecting the brainstem, cerebellar peduncles and cerebral hemispheric deep white matter including the corpus callosum. This appearance could be due to multiple sclerosis or small-vessel ischemia. At this time would not obtain LP.  Patient WILL NEED out patient neurology F/U in 1 month for further evaluation of possible MS.    No further neurologic intervention is recommended at this time.  If further questions arise, please call or page at that time.  Thank you for allowing neurology to participate in the care of this patient.  Etta Quill PA-C [facility] [phone]  08/12/2013, 10:50 AM

## 2013-08-12 NOTE — Discharge Summary (Signed)
Physician Discharge Summary  Cherise Fedder MRN: [id number] DOB/AGE: 24-Apr-1960 53 y.o.  PCP: No primary provider on file.   Admit date: 08/07/2013 Discharge date: 08/12/2013  Discharge Diagnoses:   Leg weakness/dizziness secondary to hypertensive encephalopathy    N&V (nausea and vomiting)   Hypertensive urgency   Dysconjugate gaze   Nausea and vomiting  Followup recommendations #1 establish with PCP in 5-7 days #2 follow up with PCP and have a BMP and a CBC checked    Medication List         aspirin 81 MG EC tablet  Take 1 tablet (81 mg total) by mouth daily.     hydrochlorothiazide 25 MG tablet  Commonly known as:  HYDRODIURIL  Take 1 tablet (25 mg total) by mouth daily.     lisinopril 20 MG tablet  Commonly known as:  PRINIVIL,ZESTRIL  Take 1 tablet (20 mg total) by mouth daily.     simvastatin 20 MG tablet  Commonly known as:  ZOCOR  Take 1 tablet (20 mg total) by mouth daily at 6 PM.        Discharge Condition: *  Disposition: 01-Home or Self Care   Consults: Neurology   Significant Diagnostic Studies: Ct Head Wo Contrast  08/07/2013   CLINICAL DATA:  Dizziness, elevated blood pressure  EXAM: CT HEAD WITHOUT CONTRAST  TECHNIQUE: Contiguous axial images were obtained from the base of the skull through the vertex without intravenous contrast.  COMPARISON:  None.  FINDINGS: There is no evidence of mass effect, midline shift or extra-axial fluid collections. There is no evidence of a space-occupying lesion or intracranial hemorrhage. There is no evidence of a cortical-based area of acute infarction.  The ventricles and sulci are appropriate for the patient's age. The basal cisterns are patent.  Visualized portions of the orbits are unremarkable. The visualized portions of the paranasal sinuses and mastoid air cells are unremarkable.  The osseous structures are unremarkable.  IMPRESSION: Normal CT of the brain without intravenous contrast.   Electronically  Signed   By: Kathreen Devoid   On: 08/07/2013 21:51   Mr Brain W Wo Contrast  08/08/2013   CLINICAL DATA:  Nausea and vomiting.  Gaze disturbance.  EXAM: MRI HEAD WITHOUT AND WITH CONTRAST  TECHNIQUE: Multiplanar, multiecho pulse sequences of the brain and surrounding structures were obtained without and with intravenous contrast.  CONTRAST:  70m MULTIHANCE GADOBENATE DIMEGLUMINE 529 MG/ML IV SOLN  COMPARISON:  Head CT 718  FINDINGS: There are foci of abnormal T2 and FLAIR signal affecting the brainstem, middle cerebellar peduncle on the left, corpus callosum, and hemispheric deep and subcortical white matter. The pattern of involvement is suggestive of multiple sclerosis, but not entirely specific. The findings could be due to small vessel ischemic changes. There is a 3 mm focus of restricted diffusion in the middle cerebellar peduncle on the left. A few of the other hemispheric white matter lesions show a low level restricted diffusion. None of the foci show abnormal contrast enhancement. There is no cortical or large vessel territory insult. No mass lesion, hydrocephalus or extra-axial collection. There are punctate foci of hemosiderin deposition within the brain stem, thalami and scatter within the hemispheric white matter. These could be micro hemorrhages associated with old infarctions or demyelinating lesions or could relate to old closed head injury. No evidence of acute hemorrhage. No hydrocephalus or extra-axial collection. No pituitary mass. No inflammatory sinus disease.  IMPRESSION: Foci of abnormal signal affecting the brainstem, cerebellar peduncles  and cerebral hemispheric deep white matter including the corpus callosum. This appearance could be due to multiple sclerosis or small-vessel ischemia. There is a 3 mm focus of restricted diffusion in the left middle cerebellar peduncle and a few small areas in the hemispheric white matter. If the underlying pathology is multiple sclerosis, these are  active lesions. If the underlying pathology is small vessel ischemia, these are acute/ subacute small vessel infarctions. I favor multiple sclerosis in this case. There is, however, the atypical feature of punctate foci of hemosiderin deposition within the brain, more commonly seen with small vessel infarctions than demyelinating disease. Is there history of old head trauma?   Electronically Signed   By: Nelson Chimes M.D.   On: 08/08/2013 17:39   Mr Cervical Spine W Wo Contrast  08/11/2013   CLINICAL DATA:  New onset headache. Nausea, vomiting and photophobia. Assess for multiple sclerosis.  EXAM: MRI CERVICAL SPINE WITHOUT AND WITH CONTRAST  TECHNIQUE: Multiplanar and multiecho pulse sequences of the cervical spine, to include the craniocervical junction and cervicothoracic junction, were obtained according to standard protocol without and with intravenous contrast.  CONTRAST:  59m MULTIHANCE GADOBENATE DIMEGLUMINE 529 MG/ML IV SOLN  COMPARISON:  MRI brain 08/08/2013  FINDINGS: The cervical spinal cord appears normal. No evidence of demyelinating disease in this region. No abnormal contrast enhancement.  There is straightening and slight kyphotic curvature of the spine. There is degenerative spondylosis with non-compressive disc bulges at C3-4, C4-5, C5-6 and C6-7. No foraminal stenosis from C3-4 through C5-6. At C6-7, there is foraminal narrowing on the left because of osteophytic encroachment.  C7-T1 shows mild facet degeneration but no stenosis.  IMPRESSION: No cord abnormality to suggest multiple sclerosis involvement in this region.  Cervical spondylosis. No central canal stenosis. Foraminal narrowing on the left at C6-7 because of osteophytic encroachment.   Electronically Signed   By: MNelson ChimesM.D.   On: 08/11/2013 15:34     Microbiology: No results found for this or any previous visit (from the past 240 hour(s)).   Labs: Results for orders placed during the hospital encounter of 08/07/13  (from the past 48 hour(s))  CBC     Status: Abnormal   Collection Time    08/11/13  9:20 AM      Result Value Ref Range   WBC 6.1  4.0 - 10.5 K/uL   RBC 5.39 (*) 3.87 - 5.11 MIL/uL   Hemoglobin 13.9  12.0 - 15.0 g/dL   HCT 43.7  36.0 - 46.0 %   MCV 81.1  78.0 - 100.0 fL   MCH 25.8 (*) 26.0 - 34.0 pg   MCHC 31.8  30.0 - 36.0 g/dL   RDW 14.5  11.5 - 15.5 %   Platelets 175  150 - 400 K/uL  COMPREHENSIVE METABOLIC PANEL     Status: Abnormal   Collection Time    08/11/13  9:24 AM      Result Value Ref Range   Sodium 141  137 - 147 mEq/L   Potassium 3.8  3.7 - 5.3 mEq/L   Chloride 105  96 - 112 mEq/L   CO2 22  19 - 32 mEq/L   Glucose, Bld 108 (*) 70 - 99 mg/dL   BUN 8  6 - 23 mg/dL   Creatinine, Ser 0.67  0.50 - 1.10 mg/dL   Calcium 9.1  8.4 - 10.5 mg/dL   Total Protein 6.7  6.0 - 8.3 g/dL   Albumin 3.1 (*) 3.5 - 5.2  g/dL   AST 26  0 - 37 U/L   Comment: HEMOLYSIS AT THIS LEVEL MAY AFFECT RESULT   ALT 22  0 - 35 U/L   Alkaline Phosphatase 69  39 - 117 U/L   Total Bilirubin 0.7  0.3 - 1.2 mg/dL   GFR calc non Af Amer >90  >90 mL/min   GFR calc Af Amer >90  >90 mL/min   Comment: (NOTE)     The eGFR has been calculated using the CKD EPI equation.     This calculation has not been validated in all clinical situations.     eGFR's persistently <90 mL/min signify possible Chronic Kidney     Disease.   Anion gap 14  5 - 15  COMPREHENSIVE METABOLIC PANEL     Status: Abnormal   Collection Time    08/12/13  5:37 AM      Result Value Ref Range   Sodium 140  137 - 147 mEq/L   Potassium 3.6 (*) 3.7 - 5.3 mEq/L   Chloride 103  96 - 112 mEq/L   CO2 24  19 - 32 mEq/L   Glucose, Bld 110 (*) 70 - 99 mg/dL   BUN 8  6 - 23 mg/dL   Creatinine, Ser 0.79  0.50 - 1.10 mg/dL   Calcium 8.8  8.4 - 10.5 mg/dL   Total Protein 6.1  6.0 - 8.3 g/dL   Albumin 3.0 (*) 3.5 - 5.2 g/dL   AST 17  0 - 37 U/L   ALT 17  0 - 35 U/L   Alkaline Phosphatase 64  39 - 117 U/L   Total Bilirubin 0.7  0.3 - 1.2  mg/dL   GFR calc non Af Amer >90  >90 mL/min   GFR calc Af Amer >90  >90 mL/min   Comment: (NOTE)     The eGFR has been calculated using the CKD EPI equation.     This calculation has not been validated in all clinical situations.     eGFR's persistently <90 mL/min signify possible Chronic Kidney     Disease.   Anion gap 13  5 - 15  CBC     Status: Abnormal   Collection Time    08/12/13  5:37 AM      Result Value Ref Range   WBC 6.1  4.0 - 10.5 K/uL   RBC 5.19 (*) 3.87 - 5.11 MIL/uL   Hemoglobin 13.0  12.0 - 15.0 g/dL   HCT 41.7  36.0 - 46.0 %   MCV 80.3  78.0 - 100.0 fL   MCH 25.0 (*) 26.0 - 34.0 pg   MCHC 31.2  30.0 - 36.0 g/dL   RDW 14.6  11.5 - 15.5 %   Platelets 169  150 - 400 K/uL    HPI on 7/18//2015  Ms. Semidey is a 53 y.o AAF who presented to the ER with family. She was alert and cooperative during the encounter with admitting physician. The husband stated that he left her in the morning around 9 am in perfectly good health. Their son and the husband arrived to the house around 4pm and found where she had vomited on herself. The husband also stated she was very weak in her legs and could barely walk. She had eaten some frozen stir fry dinner shortly before the vomiting and they thought this may be why she was vomiting. When she arrived to the ED, she was noted to be hypertensive in 200 SBP and the  phenergan and reglan did not seem to help with her vomiting. She continued to vomit bilious substances while in the ED after the medication. Also of note, upon re-examining the patient, the ED MD noted a dysconjugate gaze so a CT of the head without contrast was performed which was negative for any acute pathology. Neurology was consulted and Dr. Doy Mince suggested a MRI of the brain and the patient would need to be transferred to [facility].   Initial MRI brain noted some questionable demyelinating process with possible MS versus stroke.   Assessment & Plan   Dizziness with  Nausea/Vomiting, Concern for CVA vs Hypertensive Encephalopathy  -CT of the head showed no acute changes  -MRI brain: Foci of abnormal signal affecting brain, cerebellar peduncles, deep white matter including the corpus callosum, could be multiple sclerosis or small vessel ischemia. MRI of the C-spine was recommended to rule out multiple sclerosis. MRI of the C-spine was negative. No plan for LP at this time. Neurology does recommend outpatient neurology followup in one month for possible evaluation of multiple sclerosis -Echocardiogram EF 55-60%  -Carotid doppler Bilateral: 1-39% ICA stenosis. Vertebral artery flow is antegrade.  -LDL 136, hemoglobin A1c 5.8  -PT consulted and recommended home health  -Neurology consulted and following, recommended MRI cervical spine and possibly LP, and started patient on Depacon for headache , headache improved and the Depakote was discontinued prior to discharge as it was thought that the patient's headache was due to hypertensive encephalopathy We recommend to continue aspirin 81 mg and statin , weight loss Follow up in neurology in one month, no indication for lumbar puncture at this time  Nausea and vomiting  -Improving, Likely related to viral gastroenteritis vs hypertensive encephalopathy Now resolved prior to discharge  Accelerated hypertension  Started patient on lisinopril and HCTZ No prior history of hypertension  History of breast cancer  -Status post mastectomy and radiation in 1997, currently in remission   Obesity, BMI 119  -Patient will need to discuss with her primary care physician and exercise as well as diet modification program.   Hyperlipidemia  -Lipid panel: Total cholesterol 213, triglycerides 67, HDL 64, LDL 136  -Continue statin    Code Status: Full  Family Communication: family at bedside     Time Spent in minutes 25 minutes  Procedures  Echocardiogram  Study Conclusions  - Left ventricle: The cavity size was  normal. There was moderate concentric hypertrophy. Systolic function was normal. The estimated ejection fraction was in the range of 55% to 60%. Wall motion was normal; there were no regional wall motion abnormalities. - Mitral valve: There was mild regurgitation. - Left atrium: The atrium was mildly dilated.  Carotid Doppler: Bilateral: 1-39% ICA stenosis. Vertebral artery flow is antegrade.  Consults  Neurology      Discharge Exam:  Blood pressure 164/62, pulse 84, temperature 98.1 F (36.7 C), temperature source Oral, resp. rate 20, height _0  (1.626 m), weight 145.2 kg (320 lb 1.7 oz), SpO2 99.00%.  General: Well developed, well nourished, NAD, appears stated age HEENT: NCAT, mucous membranes moist. Cardiovascular: S1 S2 auscultated, no rubs, murmurs or gallops. Regular rate and rhythm. Respiratory: Clear to auscultation bilaterally with equal chest rise Abdomen: Soft, obese, nontender, nondistended, + bowel sounds Extremities: warm dry without cyanosis clubbing or edema Neuro: AAOx3, no focal deficits Skin: Without rashes exudates or nodules Psych: Normal affect and demeanor with intact judgement and insight        Discharge Instructions   Diet -  low sodium heart healthy    Complete by:  As directed      Increase activity slowly    Complete by:  As directed            Follow-up Information   Follow up with PCP. Schedule an appointment as soon as possible for a visit in 1 week.      SignedReyne Dumas 08/12/2013, 1:11 PM

## 2013-08-18 ENCOUNTER — Other Ambulatory Visit: Payer: Self-pay | Admitting: Nurse Practitioner

## 2013-08-18 DIAGNOSIS — I10 Essential (primary) hypertension: Secondary | ICD-10-CM | POA: Insufficient documentation

## 2013-08-18 DIAGNOSIS — Z853 Personal history of malignant neoplasm of breast: Secondary | ICD-10-CM | POA: Insufficient documentation

## 2013-08-18 DIAGNOSIS — Z1231 Encounter for screening mammogram for malignant neoplasm of breast: Secondary | ICD-10-CM

## 2013-08-19 ENCOUNTER — Telehealth: Payer: Self-pay | Admitting: Neurology

## 2013-08-19 ENCOUNTER — Other Ambulatory Visit: Payer: Self-pay | Admitting: Nurse Practitioner

## 2013-08-19 DIAGNOSIS — Z1231 Encounter for screening mammogram for malignant neoplasm of breast: Secondary | ICD-10-CM

## 2013-08-19 DIAGNOSIS — Z9012 Acquired absence of left breast and nipple: Secondary | ICD-10-CM

## 2013-08-19 DIAGNOSIS — Z853 Personal history of malignant neoplasm of breast: Secondary | ICD-10-CM

## 2013-08-19 NOTE — Telephone Encounter (Signed)
Patient has not been seen in our office but a referral has been placed by [facility], per Rachel Moulds, has been trying to contact patient to schedule an appt.,has not been able to reach

## 2013-08-23 NOTE — Telephone Encounter (Signed)
done

## 2013-08-26 ENCOUNTER — Encounter (INDEPENDENT_AMBULATORY_CARE_PROVIDER_SITE_OTHER): Payer: Self-pay

## 2013-08-26 ENCOUNTER — Ambulatory Visit (INDEPENDENT_AMBULATORY_CARE_PROVIDER_SITE_OTHER): Payer: BC Managed Care – PPO | Admitting: Neurology

## 2013-08-26 ENCOUNTER — Encounter: Payer: Self-pay | Admitting: Neurology

## 2013-08-26 ENCOUNTER — Encounter (INDEPENDENT_AMBULATORY_CARE_PROVIDER_SITE_OTHER): Payer: BC Managed Care – PPO

## 2013-08-26 VITALS — BP 168/95 | HR 78 | Ht 63.5 in | Wt 305.6 lb

## 2013-08-26 DIAGNOSIS — I6350 Cerebral infarction due to unspecified occlusion or stenosis of unspecified cerebral artery: Secondary | ICD-10-CM

## 2013-08-26 DIAGNOSIS — E7849 Other hyperlipidemia: Secondary | ICD-10-CM

## 2013-08-26 DIAGNOSIS — D229 Melanocytic nevi, unspecified: Secondary | ICD-10-CM | POA: Insufficient documentation

## 2013-08-26 DIAGNOSIS — D239 Other benign neoplasm of skin, unspecified: Secondary | ICD-10-CM

## 2013-08-26 DIAGNOSIS — Z8673 Personal history of transient ischemic attack (TIA), and cerebral infarction without residual deficits: Secondary | ICD-10-CM | POA: Insufficient documentation

## 2013-08-26 DIAGNOSIS — I6789 Other cerebrovascular disease: Secondary | ICD-10-CM | POA: Insufficient documentation

## 2013-08-26 DIAGNOSIS — R9409 Abnormal results of other function studies of central nervous system: Secondary | ICD-10-CM

## 2013-08-26 DIAGNOSIS — Z0289 Encounter for other administrative examinations: Secondary | ICD-10-CM

## 2013-08-26 DIAGNOSIS — E782 Mixed hyperlipidemia: Secondary | ICD-10-CM

## 2013-08-26 DIAGNOSIS — I6381 Other cerebral infarction due to occlusion or stenosis of small artery: Secondary | ICD-10-CM | POA: Insufficient documentation

## 2013-08-26 HISTORY — DX: Other hyperlipidemia: E78.49

## 2013-08-26 HISTORY — DX: Melanocytic nevi, unspecified: D22.9

## 2013-08-26 NOTE — Patient Instructions (Signed)
I had a long discussion with the patient with regards to her recent admission for hypertensive urgency and personally reviewed MRI films and discussed findings, my clinical impression, plan for evaluation, treatment and answered questions. I think she likely has prior silent lacunar infarcts and a small brain stem symptomatic infarct recently. Multiple sclerosis is  less likely given absence of prior history to support it. Continue aspirin for stroke prevention and strict control of hypertension with blood pressure goal below 130/90 and lipids with LDL cholesterol goal below 100 mg percent. I also encouraged her to exercise, diet and lose weight. Check polysomnogram for sleep apnea and evoked potentials given abnormal brain MRI. Return for followup in 2 months or call earlier if necessary.  Stroke Prevention Some medical conditions and behaviors are associated with an increased chance of having a stroke. You may prevent a stroke by making healthy choices and managing medical conditions. HOW CAN I REDUCE MY RISK OF HAVING A STROKE?   Stay physically active. Get at least 30 minutes of activity on most or all days.  Do not smoke. It may also be helpful to avoid exposure to secondhand smoke.  Limit alcohol use. Moderate alcohol use is considered to be:  No more than 2 drinks per day for men.  No more than 1 drink per day for nonpregnant women.  Eat healthy foods. This involves:  Eating 5 or more servings of fruits and vegetables a day.  Making dietary changes that address high blood pressure (hypertension), high cholesterol, diabetes, or obesity.  Manage your cholesterol levels.  Making food choices that are high in fiber and low in saturated fat, trans fat, and cholesterol may control cholesterol levels.  Take any prescribed medicines to control cholesterol as directed by your health care provider.  Manage your diabetes.  Controlling your carbohydrate and sugar intake is recommended to  manage diabetes.  Take any prescribed medicines to control diabetes as directed by your health care provider.  Control your hypertension.  Making food choices that are low in salt (sodium), saturated fat, trans fat, and cholesterol is recommended to manage hypertension.  Take any prescribed medicines to control hypertension as directed by your health care provider.  Maintain a healthy weight.  Reducing calorie intake and making food choices that are low in sodium, saturated fat, trans fat, and cholesterol are recommended to manage weight.  Stop drug abuse.  Avoid taking birth control pills.  Talk to your health care provider about the risks of taking birth control pills if you are over 66 years old, smoke, get migraines, or have ever had a blood clot.  Get evaluated for sleep disorders (sleep apnea).  Talk to your health care provider about getting a sleep evaluation if you snore a lot or have excessive sleepiness.  Take medicines only as directed by your health care provider.  For some people, aspirin or blood thinners (anticoagulants) are helpful in reducing the risk of forming abnormal blood clots that can lead to stroke. If you have the irregular heart rhythm of atrial fibrillation, you should be on a blood thinner unless there is a good reason you cannot take them.  Understand all your medicine instructions.  Make sure that other conditions (such as anemia or atherosclerosis) are addressed. SEEK IMMEDIATE MEDICAL CARE IF:   You have sudden weakness or numbness of the face, arm, or leg, especially on one side of the body.  Your face or eyelid droops to one side.  You have sudden confusion.  You have trouble speaking (aphasia) or understanding.  You have sudden trouble seeing in one or both eyes.  You have sudden trouble walking.  You have dizziness.  You have a loss of balance or coordination.  You have a sudden, severe headache with no known cause.  You have  new chest pain or an irregular heartbeat. Any of these symptoms may represent a serious problem that is an emergency. Do not wait to see if the symptoms will go away. Get medical help at once. Call your local emergency services (911 in [place] Do not drive yourself to the hospital. Document Released: 02/15/2004 Document Revised: 05/24/2013 Document Reviewed: 07/10/2012 [facility] Patient Information 2015 [place], [place]. This information is not intended to replace advice given to you by your health care provider. Make sure you discuss any questions you have with your health care provider.

## 2013-08-26 NOTE — Progress Notes (Signed)
[name] [address] [place]. [place] [address] [phone]       OFFICE CONSULT NOTE  [name] Date of Birth:  06/05/1960 Medical Record Number:  [id number]   Referring MD:  Vicenta Aly  Reason for Referral:  Abnormal MRI HPI: 28 year African American lady who was recently admitted to the hospital on 08/07/13 with nausea vomiting and significantly elevated blood pressure about 390 systolic. She was also noted to have transient disconjugate gaze. CT scan of the head was performed which was unremarkable. She was thought to have hypertensive encephalopathy. She also complained of dizziness and gait ataxia. MRI scan of the brain was obtained which showed a weak diffusion-positive lesions in the left middle cerebellar peduncle as well as prior lesions in corpus callosum and subcortical white matter raising concern for multiple sclerosis. MRI scan of the cervical spine was also obtained which I personally reviewed shows only mild dizziness changes without definitive demyelinating plaques noted. Transthoracic echo showed normal ejection fraction. Carotid ultrasound showed no significant extradural stenosis. HDL cholesterol was elevated at 136, HDL was 64 and triglycerides 67 with total cholesterol being 213 mg percent. Patient was started on aspirin 81 mg as well as a statin and blood pressure medications. She states her neurological symptoms resolved completely during the hospitalization. She did have headache during hospitalization for which she was treated with IV Depacon but the headache was resolved at the time of discharge. She's done well since discharge without any new or recurrent neurological symptoms. She in fact has no complaints. Her blood pressure continues to be difficult to control and at primary physician's office it was 190/108 and today in my office it is yet elevated at 168/95. She denies any prior history suggestive of multiple sclerosis in the form of  blurred vision, loss of vision, double vision, vertigo, focal extremity weakness, numbness, excessive fatigue or bowel or bladder urgency. She has no prior history of strokes or TIAs her family history of strokes at a young age. She does admit to snoring but is not sure if she quits breathing while sleeping. She gets tired occasionally. She has never been tested for sleep apnea. She has gained a lot of weight in the last couple of years as she states she is in a very stressful job being a Librarian, academic for 2 mental group homes.  ROS:   14 system review of systems is positive for weight loss, insomnia, not enough sleep, nausea, vomiting, skin moles and all the systems negative PMH:  Past Medical History  Diagnosis Date  . Breast cancer   . Hypertension   . High cholesterol     Social History:  History   Social History  . Marital Status: Married    Spouse Name: N/A    Number of Children: 4  . Years of Education: MA   Occupational History  .     Social History Main Topics  . Smoking status: Never Smoker   . Smokeless tobacco: Not on file  . Alcohol Use: No  . Drug Use: No  . Sexual Activity: Yes   Other Topics Concern  . Not on file   Social History Narrative  . No narrative on file    Medications:   Current Outpatient Prescriptions on File Prior to Visit  Medication Sig Dispense Refill  . aspirin EC 81 MG EC tablet Take 1 tablet (81 mg total) by mouth daily.  60 tablet  2  . hydrochlorothiazide (HYDRODIURIL) 25 MG tablet  Take 1 tablet (25 mg total) by mouth daily.  30 tablet  2  . lisinopril (PRINIVIL,ZESTRIL) 20 MG tablet Take 1 tablet (20 mg total) by mouth daily.  30 tablet  2  . simvastatin (ZOCOR) 20 MG tablet Take 1 tablet (20 mg total) by mouth daily at 6 PM.  30 tablet  2   No current facility-administered medications on file prior to visit.    Allergies:  No Known Allergies  Physical Exam General: well developed, well nourished, seated, in no evident  distress Head: head normocephalic and atraumatic. Orohparynx benign Neck: supple with no carotid or supraclavicular bruits Cardiovascular: regular rate and rhythm, no murmurs Musculoskeletal: no deformity Skin:  no rash/petichiae Vascular:  Normal pulses all extremities Filed Vitals:   08/26/13 0959  BP: 168/95  Pulse: 78    Neurologic Exam Mental Status: Awake and fully alert. Oriented to place and time. Recent and remote memory intact. Attention span, concentration and fund of knowledge appropriate. Mood and affect appropriate.  Cranial Nerves: Fundoscopic exam reveals sharp disc margins. Pupils equal, briskly reactive to light. Extraocular movements full without nystagmus. Visual fields full to confrontation. Hearing intact. Facial sensation intact. Face, tongue, palate moves normally and symmetrically.  Motor: Normal bulk and tone. Normal strength in all tested extremity muscles. Sensory.: intact to touch and pinprick and vibratory sensation.  Coordination: Rapid alternating movements normal in all extremities. Finger-to-nose and heel-to-shin performed accurately bilaterally. Gait and Station: Arises from chair without difficulty. Stance is normal. Gait demonstrates normal stride length and balance . Able to heel, toe and tandem walk with mild difficulty.  Reflexes: 1+ and symmetric. Toes downgoing.   NIHSS  0 Modified Rankin  0   ASSESSMENT: 1 year obese African American lady who with recent hospitalization on 08/07/13 with hypertensive urgency nausea and vomiting do small brain stem infarct due to small vessel disease. Abnormal MRI scan the brain showing small lesions in corpus callosum and subcortical white matter also likely silent lacunar infarcts. Multiple sclerosis is unlikely given absence of any supportive clinical symptoms in the past. Vascular risk factors of hypertension, hyperlipidemia, obesity and suspected sleep apnea    PLAN: I had a long discussion with the  patient with regards to her recent admission for hypertensive urgency and personally reviewed MRI films and discussed findings, my clinical impression, plan for evaluation, treatment and answered questions. I think she likely has prior silent lacunar infarcts and a small brain stem symptomatic infarct recently. Multiple sclerosis is  less likely given absence of prior history to support it. Continue aspirin for stroke prevention and strict control of hypertension with blood pressure goal below 130/90 and lipids with LDL cholesterol goal below 100 mg percent. I also encouraged her to exercise, diet and lose weight. Check polysomnogram for sleep apnea and evoked potentials given abnormal brain MRI. Return for followup in 2 months or call earlier if necessary.   Note: This document was prepared with digital dictation and possible smart phrase technology. Any transcriptional errors that result from this process are unintentional.

## 2013-08-30 ENCOUNTER — Telehealth: Payer: Self-pay | Admitting: Neurology

## 2013-08-30 DIAGNOSIS — R51 Headache: Secondary | ICD-10-CM

## 2013-08-30 DIAGNOSIS — I6381 Other cerebral infarction due to occlusion or stenosis of small artery: Secondary | ICD-10-CM

## 2013-08-30 DIAGNOSIS — R0683 Snoring: Secondary | ICD-10-CM

## 2013-08-30 DIAGNOSIS — R519 Headache, unspecified: Secondary | ICD-10-CM

## 2013-08-30 NOTE — Telephone Encounter (Signed)
.   Dr. Antony Contras is referring Leslie Graves, 53 y.o. female, for an attended sleep study.  Wt: 305 lbs. Ht: 63.5 in. BMI: 53.28  Diagnoses: Morbid Obesity Lacunar Infarct Hypertension Hyperlipidemia Snoring Fatigue  Medication List: Current Outpatient Prescriptions  Medication Sig Dispense Refill  . aspirin EC 81 MG EC tablet Take 1 tablet (81 mg total) by mouth daily.  60 tablet  2  . hydrochlorothiazide (HYDRODIURIL) 25 MG tablet Take 1 tablet (25 mg total) by mouth daily.  30 tablet  2  . lisinopril (PRINIVIL,ZESTRIL) 20 MG tablet Take 1 tablet (20 mg total) by mouth daily.  30 tablet  2  . simvastatin (ZOCOR) 20 MG tablet Take 1 tablet (20 mg total) by mouth daily at 6 PM.  30 tablet  2   No current facility-administered medications for this visit.    This patient presents to Dr. Antony Contras in consultation for a hospital follow up.  This morbidly obese patient has complaint of headache, excessive fatigue, and snoring.  Cardiovascular risk factors other than morbid obesity include, hypertension, and hypercholesterolemia.  Please review for an attended sleep study to rule out osa.  Insurance:  BCBS - Prior authorization is not required.

## 2013-08-31 NOTE — Telephone Encounter (Signed)
Sleep study request review: This patient has an underlying medical history of morbid obesity, hypertension, hyperlipidemia, recent lacunar stroke and is referred by Dr. Leonie Man for an attended sleep study due to a report of snoring, daytime somnolence, recurrent headaches. I will order a split-night sleep study and see the patient in sleep medicine consultation afterwards. Star Age, MD, PhD [name])

## 2013-09-02 ENCOUNTER — Ambulatory Visit: Payer: BC Managed Care – PPO

## 2013-09-09 ENCOUNTER — Ambulatory Visit
Admission: RE | Admit: 2013-09-09 | Discharge: 2013-09-09 | Disposition: A | Discharge status: Home / Self Care | Payer: BC Managed Care – PPO | Source: Ambulatory Visit | Attending: Nurse Practitioner | Admitting: Nurse Practitioner

## 2013-09-09 DIAGNOSIS — Z853 Personal history of malignant neoplasm of breast: Secondary | ICD-10-CM

## 2013-09-09 DIAGNOSIS — Z1231 Encounter for screening mammogram for malignant neoplasm of breast: Secondary | ICD-10-CM

## 2013-09-09 DIAGNOSIS — Z9012 Acquired absence of left breast and nipple: Secondary | ICD-10-CM

## 2013-09-10 ENCOUNTER — Other Ambulatory Visit: Payer: Self-pay | Admitting: Nurse Practitioner

## 2013-09-10 DIAGNOSIS — N63 Unspecified lump in unspecified breast: Secondary | ICD-10-CM

## 2013-09-24 ENCOUNTER — Other Ambulatory Visit: Payer: BC Managed Care – PPO

## 2013-09-24 DIAGNOSIS — Z8673 Personal history of transient ischemic attack (TIA), and cerebral infarction without residual deficits: Secondary | ICD-10-CM | POA: Insufficient documentation

## 2013-09-24 HISTORY — DX: Personal history of transient ischemic attack (TIA), and cerebral infarction without residual deficits: Z86.73

## 2013-09-30 ENCOUNTER — Ambulatory Visit
Admission: RE | Admit: 2013-09-30 | Discharge: 2013-09-30 | Disposition: A | Discharge status: Home / Self Care | Payer: BC Managed Care – PPO | Source: Ambulatory Visit | Attending: Nurse Practitioner | Admitting: Nurse Practitioner

## 2013-09-30 ENCOUNTER — Other Ambulatory Visit: Payer: Self-pay | Admitting: Nurse Practitioner

## 2013-09-30 DIAGNOSIS — N6310 Unspecified lump in the right breast, unspecified quadrant: Secondary | ICD-10-CM

## 2013-09-30 DIAGNOSIS — N63 Unspecified lump in unspecified breast: Secondary | ICD-10-CM

## 2013-10-06 ENCOUNTER — Telehealth: Payer: Self-pay | Admitting: Obstetrics & Gynecology

## 2013-10-06 ENCOUNTER — Telehealth: Payer: Self-pay | Admitting: Neurology

## 2013-10-06 NOTE — Telephone Encounter (Signed)
Left message to call back to schedule a new patient MD referral with Dr. Sabra Heck.

## 2013-10-06 NOTE — Telephone Encounter (Signed)
Called Leslie Graves and left message asking her if she would like to come in to see Jeani Hawking, NP next week on the 23 or 24 of September. Leslie Graves has an appt with Dr. Leonie Man on 05/02/14. Left office number and my name, Tye Maryland, CMA.

## 2013-10-11 ENCOUNTER — Inpatient Hospital Stay: Admission: RE | Admit: 2013-10-11 | Payer: BC Managed Care – PPO | Source: Ambulatory Visit

## 2013-10-11 ENCOUNTER — Other Ambulatory Visit: Payer: BC Managed Care – PPO

## 2013-10-11 ENCOUNTER — Other Ambulatory Visit: Payer: Self-pay | Admitting: Nurse Practitioner

## 2013-10-11 DIAGNOSIS — N6310 Unspecified lump in the right breast, unspecified quadrant: Secondary | ICD-10-CM

## 2013-10-18 ENCOUNTER — Other Ambulatory Visit: Payer: Self-pay | Admitting: Nurse Practitioner

## 2013-10-18 DIAGNOSIS — N6310 Unspecified lump in the right breast, unspecified quadrant: Secondary | ICD-10-CM

## 2013-10-19 ENCOUNTER — Inpatient Hospital Stay: Admission: RE | Admit: 2013-10-19 | Payer: BC Managed Care – PPO | Source: Ambulatory Visit

## 2013-10-19 ENCOUNTER — Other Ambulatory Visit: Payer: BC Managed Care – PPO

## 2013-10-22 DIAGNOSIS — R87613 High grade squamous intraepithelial lesion on cytologic smear of cervix (HGSIL): Secondary | ICD-10-CM | POA: Insufficient documentation

## 2013-10-22 DIAGNOSIS — N63 Unspecified lump in unspecified breast: Secondary | ICD-10-CM

## 2013-10-22 DIAGNOSIS — R87619 Unspecified abnormal cytological findings in specimens from cervix uteri: Secondary | ICD-10-CM | POA: Insufficient documentation

## 2013-10-22 HISTORY — DX: Unspecified lump in unspecified breast: N63.0

## 2013-10-22 NOTE — Telephone Encounter (Signed)
Precert for colpo same day as NGYN.PR $25 copay per [place] with Insurance and billing. Call to patient. Offered appt with Dr Sabra Heck for Monday 10-25-13. Patient states, "Does it have to be so soon? My birthday is coming and i dont want to be dealing with this on my birthday." [place] appointments, especially with a procedures are not readily available and I would not recommend she delay that much longer. Patient states "I am not ready right now." Advised I would let [facility] know she declines appointment and they can contact us if and when she is ready to schedule. Patient states she has appointment there today. Call to [facility], spoke to Taylor CMA for Vicenta Aly FNP. Update given that patient declines referral.  Routing to provider for final review. Patient agreeable to disposition. Will close encounter

## 2013-11-01 ENCOUNTER — Telehealth: Payer: Self-pay | Admitting: Obstetrics & Gynecology

## 2013-11-03 NOTE — Telephone Encounter (Signed)
lmtcb-? If would like to schedule appt with Dr Sabra Heck on 11/05/13//kn

## 2013-11-04 ENCOUNTER — Encounter: Payer: Self-pay | Admitting: Obstetrics & Gynecology

## 2013-11-04 ENCOUNTER — Ambulatory Visit (INDEPENDENT_AMBULATORY_CARE_PROVIDER_SITE_OTHER): Payer: BC Managed Care – PPO | Admitting: Obstetrics & Gynecology

## 2013-11-04 VITALS — BP 150/98 | HR 72 | Resp 20 | Ht 64.5 in | Wt 304.6 lb

## 2013-11-04 DIAGNOSIS — R87613 High grade squamous intraepithelial lesion on cytologic smear of cervix (HGSIL): Secondary | ICD-10-CM

## 2013-11-04 NOTE — Progress Notes (Signed)
53 y.o. G27P4 MarriedAfrican AmericanF here for new pt visit.  HGSIL pap obtained on screening Pap with Vicenta Aly.  Pt reports she has had abnormal Pap smears in the past but never had a colposcopy.  Breast biopsy scheduled 11/10/13 due to breast mass and abnormal mmg.  No vaginal bleeding.  No on HRT.  Patient's last menstrual period was 01/22/2011.          Sexually active: Yes.    The current method of family planning is tubal ligation.    Exercising: No.  was doing Zumba Smoker:  Former smoker-quit 15-20 years ago  Health Maintenance: Pap:  2015 HGSIL/positive HR HPV History of abnormal Pap:  Yes-states always abnormal-but no follow up has been needed? MMG:  09/30/13-right diag mmg/us-scheduled for biopsy 11/10/13 Colonoscopy:  none BMD:   none TDaP:  ? Screening Labs: PCP, Hb today: PCP, Urine today: PCP   reports that she has quit smoking. She has never used smokeless tobacco. She reports that she does not drink alcohol or use illicit drugs.  Past Medical History  Diagnosis Date  . Breast cancer     19 years ago-left breast mastectomy-no chemo or radiation  . Hypertension   . High cholesterol   . Stroke 7/15    also had mini strokes  . Anemia     Past Surgical History  Procedure Laterality Date  . Cholecystectomy    . Mastectomy Left   . Tubal ligation      Current Outpatient Prescriptions  Medication Sig Dispense Refill  . amLODipine (NORVASC) 5 MG tablet Take 5 mg by mouth.      Marland Kitchen aspirin EC 81 MG EC tablet Take 1 tablet (81 mg total) by mouth daily.  60 tablet  2  . hydrochlorothiazide (HYDRODIURIL) 25 MG tablet Take 1 tablet (25 mg total) by mouth daily.  30 tablet  2  . losartan (COZAAR) 100 MG tablet Take 100 mg by mouth.      . simvastatin (ZOCOR) 20 MG tablet Take 1 tablet (20 mg total) by mouth daily at 6 PM.  30 tablet  2   No current facility-administered medications for this visit.    Family History  Problem Relation Age of Onset  . Cancer  Mother     pancreatic  . Heart attack Father   . Hypertension Daughter   . Hypertension Son     ROS:  Pertinent items are noted in HPI.  Otherwise, a comprehensive ROS was negative.  Exam:   BP 150/98  Pulse 72  Resp 20  Ht 5' 4.5" (1.638 m)  Wt 304 lb 9.6 oz (138.166 kg)  BMI 51.50 kg/m2  LMP 01/22/2011     Height: 5' 4.5" (163.8 cm)  Ht Readings from Last 3 Encounters:  11/04/13 5' 4.5" (1.638 m)  08/26/13 5' 3.5" (1.613 m)  08/08/13 5\' 4"  (1.626 m)    General appearance: alert, cooperative and appears stated age Head: Normocephalic, without obvious abnormalities Extremities: extremities normal, atraumatic, no cyanosis or edema Skin: Skin color, texture, turgor normal. No rashes or lesions Lymph nodes: Cervical, supraclavicular, and axillary nodes normal. No abnormal inguinal nodes palpated Neurologic: Grossly normal  Pelvic: External genitalia:  no lesions              Urethra:  normal appearing urethra with no masses, tenderness or lesions              Bartholins and Skenes: normal  Vagina: normal appearing vagina with normal color and discharge, no lesions              Cervix: no lesions              Pap taken: No. Bimanual Exam:  Uterus:  normal size, contour, position, consistency, mobility, non-tender              Adnexa: normal adnexa and no mass, fullness, tenderness               Rectovaginal: Confirms               Anus:  normal sphincter tone, no lesions  Colposcopy recommended and performed today while pt in office.    Speculum placed.  3% acetic acid applied to cervix for >45 seconds.  Cervix visualized with both 7.5X and 15X magnification.  Green filter also used.  Lugols solution was used.  Findings:  AWE at 5-4 o'lcock as well as at 11 o'clock.  Biopsy:  At 6 and 11.  ECC:  was performed.  Monsel's was needed as there was significant bleeding at the 6 o'clock position.  Excellent hemostasis was present.  Pt tolerated procedure well and all  instruments were removed.  Pt was watched for about 45 minutes after exam and cervix re visualized to ensure excellent hemostasis.   A:  HGSIL pap Breast mass with findings on MMG concerning for malignancy  P:    Biopsies and ECC pending.  Will probably need a LEEP for treatment. Pt has follow up for breast finding 11/10/13.  An After Visit Summary was printed and given to the patient.

## 2013-11-06 ENCOUNTER — Other Ambulatory Visit (HOSPITAL_COMMUNITY): Payer: Self-pay | Admitting: Internal Medicine

## 2013-11-10 ENCOUNTER — Inpatient Hospital Stay: Admission: RE | Admit: 2013-11-10 | Payer: BC Managed Care – PPO | Source: Ambulatory Visit

## 2013-11-10 ENCOUNTER — Other Ambulatory Visit: Payer: BC Managed Care – PPO

## 2013-11-10 LAB — IPS OTHER TISSUE BIOPSY

## 2013-11-18 ENCOUNTER — Telehealth: Payer: Self-pay | Admitting: *Deleted

## 2013-11-18 DIAGNOSIS — R87613 High grade squamous intraepithelial lesion on cytologic smear of cervix (HGSIL): Secondary | ICD-10-CM

## 2013-11-18 NOTE — Telephone Encounter (Signed)
Left message for patient to call back. Need to go over benefits for LEEP.

## 2013-11-18 NOTE — Telephone Encounter (Signed)
Call to patient to discuss results. LMTCB ask for triage,

## 2013-11-18 NOTE — Telephone Encounter (Signed)
Message copied by Jaymes Graff on Thu Nov 18, 2013 12:21 PM ------      Message from: Megan Salon      Created: Wed Nov 17, 2013  8:02 AM       Please inform biopsy and ECC both showed High grade cells.  Needs LEEP.  Pt with hx of several abnormal paps.  This was first colposcopy for her. ------

## 2013-11-18 NOTE — Telephone Encounter (Signed)
Patient notified of message from Dr. Sabra Heck.  She is agreeable to scheduling LEEP. Brief description of procedure given to patient.  LEEP pre-procedure instructions given. Advised 800 mg of Motrin with food one hour prior to appointment. Motrin=Advil=Ibuprofen Can take 800 mg (Can purchase over the counter, you will need four 200 mg pills), patient would like to use Aleve, advised okay to do so, follow package directions.. Make sure to eat a meal before appointment and drink plenty of fluids.  Patient post menopausal. Advised will need to cancel within 24 hours or will have $100.00 no show fee placed to account.  Patient is notified she will be contacted with out of pocket cost information.    LEEP scheduled for 12/13/2013 at 3:30 time and day per Lamont Snowball, RN Routing to provider for final review. Patient agreeable to disposition. Will close encounter

## 2013-11-18 NOTE — Telephone Encounter (Signed)
Pr $0 °

## 2013-11-22 ENCOUNTER — Other Ambulatory Visit: Payer: BC Managed Care – PPO

## 2013-11-22 ENCOUNTER — Encounter: Payer: Self-pay | Admitting: Obstetrics & Gynecology

## 2013-12-02 ENCOUNTER — Telehealth: Payer: Self-pay | Admitting: Obstetrics & Gynecology

## 2013-12-02 NOTE — Telephone Encounter (Signed)
Pt wants to reschedule her appointment for a procedure (Leep) on 12/13/13

## 2013-12-03 NOTE — Telephone Encounter (Signed)
Return call to patient, LMTCB.  

## 2013-12-03 NOTE — Telephone Encounter (Signed)
Patient needs to reschedule LEEP to first of year due to termination of insurance.Patient states she does not have any other option but to reschedule when has been added to husband's policy in January.  LEEP rescheuled to 02-01-14. Instructed to call with new insurance info when available.  Recall for 02-04-2014 entered.  Advised Dr Sabra Heck will review call, will call her if any additional recommendations.  Gabriel Cirri, please update precert for jan date.

## 2013-12-03 NOTE — Telephone Encounter (Signed)
Patient called back to update her phone number. New phone number is [phone]. Updated on account as well.

## 2013-12-13 ENCOUNTER — Ambulatory Visit: Payer: BC Managed Care – PPO | Admitting: Obstetrics & Gynecology

## 2013-12-15 NOTE — Telephone Encounter (Signed)
LEEP needs to be done.  January is ok but she REALLY needs to go for her breast biopsy.  MMG was abnormal.

## 2013-12-20 ENCOUNTER — Other Ambulatory Visit: Payer: Self-pay | Admitting: Obstetrics and Gynecology

## 2013-12-20 ENCOUNTER — Other Ambulatory Visit: Payer: Self-pay | Admitting: Nurse Practitioner

## 2013-12-20 ENCOUNTER — Encounter (HOSPITAL_COMMUNITY): Payer: Self-pay | Admitting: *Deleted

## 2013-12-20 DIAGNOSIS — N6310 Unspecified lump in the right breast, unspecified quadrant: Secondary | ICD-10-CM

## 2013-12-20 NOTE — Telephone Encounter (Signed)
Call to [place] to [place], no option for patient at [facility] except self pay discount. Suggested I call [facility] [phone]. Call to [place] , Spoke to [place]. She states patient needs to be seen by them ASAP, they will work her in this week. If qualifies, she may be able to get LEEP done with them as well. Gabriel Cirri needs to speak to patient ASAP in order to get her scheduled this week.  Call back to patient to. LMTCB X3. Patient returned call approximately 3pm. States Gabriel Cirri had actually called her directly. She is scheduled to see them and is aware she may be able to get LEEP done thru [facility]. Will keep currently scheduled LEEP appointment until patient calls back with update. Recall entered for 02-04-14. Appointment is scheduled with [facility] for 12-23-13, viewable in EP[facility] appt desk.  Routing to provider for final review. Patient agreeable to disposition. Will close encounter

## 2013-12-20 NOTE — Telephone Encounter (Signed)
Call to patient to discuss need for breast biopsy. See MMG report ordered by T. Ouida Sills FNP. Patient states she cannot schedule this due to no insurance and her husband is the only one working and they have extensive bills. Advised that biopsy is essential and that delay can actaully result in potential increased cost of additional treatment. Advised we try to find affordable option for care, patient agreeable and states is willing to go if we can find another option. Advised I will call her back.

## 2013-12-22 ENCOUNTER — Other Ambulatory Visit: Payer: Self-pay | Admitting: Obstetrics and Gynecology

## 2013-12-22 DIAGNOSIS — N6310 Unspecified lump in the right breast, unspecified quadrant: Secondary | ICD-10-CM

## 2013-12-23 ENCOUNTER — Ambulatory Visit
Admission: RE | Admit: 2013-12-23 | Discharge: 2013-12-23 | Disposition: A | Discharge status: Home / Self Care | Payer: No Typology Code available for payment source | Source: Ambulatory Visit | Attending: Obstetrics and Gynecology | Admitting: Obstetrics and Gynecology

## 2013-12-23 ENCOUNTER — Encounter (HOSPITAL_COMMUNITY): Payer: Self-pay

## 2013-12-23 ENCOUNTER — Ambulatory Visit (HOSPITAL_COMMUNITY)
Admission: RE | Admit: 2013-12-23 | Discharge: 2013-12-23 | Disposition: A | Discharge status: Home / Self Care | Payer: BC Managed Care – PPO | Source: Ambulatory Visit | Attending: Obstetrics and Gynecology | Admitting: Obstetrics and Gynecology

## 2013-12-23 VITALS — BP 164/96 | Ht 64.0 in | Wt 312.6 lb

## 2013-12-23 DIAGNOSIS — N6310 Unspecified lump in the right breast, unspecified quadrant: Secondary | ICD-10-CM

## 2013-12-23 DIAGNOSIS — Z1239 Encounter for other screening for malignant neoplasm of breast: Secondary | ICD-10-CM

## 2013-12-23 DIAGNOSIS — N6311 Unspecified lump in the right breast, upper outer quadrant: Secondary | ICD-10-CM

## 2013-12-23 DIAGNOSIS — R2231 Localized swelling, mass and lump, right upper limb: Secondary | ICD-10-CM

## 2013-12-23 NOTE — Patient Instructions (Signed)
Explained to  Leslie Graves about [facility] and the process if her biopsy is positive. Explained to patient the importance of her follow-up for her Pap smear. Referred patient to the [facility] for right breast biopsy per recommendation. Appointment scheduled for Thursday, December 23, 2013 at 1230. Patient aware of appointment and will be there. Let patient know the [facility] will follow up with her on her biopsy results. Leslie Graves verbalized understanding.  Zebedee Segundo, Arvil Chaco, RN 10:53 AM

## 2013-12-23 NOTE — Progress Notes (Signed)
Complaints of right breast lump x 6 months. Patient had a Diagnostic mammogram at the [facility] 09/30/2013 and a right breast biopsy is recommended for follow-up.   Pap Smear:  Pap smear not completed today. Last Pap smear was in September 2015 at [facility] and HSIL. A colposcopy was completed 11/04/2013 for follow-up showing high grade cells. A LEEP is scheduled 02/01/2014 for follow-up. Per patient has had two or three abnormal Pap smears prior that a repeat Pap smear was completed for follow-up. No Pap smear results in [facility].  Physical exam: Breasts Right breast larger than left to history of left breast mastectomy 20 years ago for breast cancer. No skin abnormalities bilateral breasts. Right breast dimpling observed at area of lump. No nipple retraction right breast. No nipple discharge right breast. No lymphadenopathy left axilla. Right axillary lymphadenopathy. Palpated lump within the right axillary and right breast at 10 o'clock 12 cm from the nipple. No complaints of pain or tenderness on exam. Referred patient to the [facility] for right breast biopsy per recommendation. Appointment scheduled for Thursday, December 23, 2013 at 1230.     Pelvic/Bimanual No Pap smear completed today since last Pap smear was in September 2015. Pap smear not indicated per [facility] guidelines.

## 2013-12-23 NOTE — Addendum Note (Signed)
Encounter addended by: Loletta Parish, RN on: 12/23/2013 11:23 AM<BR>     Documentation filed: Visit Diagnoses

## 2013-12-31 ENCOUNTER — Ambulatory Visit: Payer: BC Managed Care – PPO | Admitting: Obstetrics & Gynecology

## 2014-01-31 ENCOUNTER — Telehealth: Payer: Self-pay | Admitting: Obstetrics & Gynecology

## 2014-01-31 NOTE — Telephone Encounter (Signed)
Call to patient to go over benefits for LEEP per new coverage.  Left message for patient to call back. PR: $25

## 2014-01-31 NOTE — Telephone Encounter (Signed)
Spoke with patient. Advised that per benefit quote received from new insurance carrier, she will be responsible to pay $25 when she comes in for procedure. Patient agreeable.

## 2014-02-01 ENCOUNTER — Ambulatory Visit (INDEPENDENT_AMBULATORY_CARE_PROVIDER_SITE_OTHER): Payer: BLUE CROSS/BLUE SHIELD | Admitting: Obstetrics & Gynecology

## 2014-02-01 VITALS — BP 142/88 | HR 64 | Resp 16 | Wt 309.0 lb

## 2014-02-01 DIAGNOSIS — D069 Carcinoma in situ of cervix, unspecified: Secondary | ICD-10-CM

## 2014-02-01 DIAGNOSIS — R87613 High grade squamous intraepithelial lesion on cytologic smear of cervix (HGSIL): Secondary | ICD-10-CM

## 2014-02-01 NOTE — Progress Notes (Signed)
54 y.o. 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  Pap obtained before colposcopy showed HGSIL.  Pt was a referral from [facility].  Pt did have breast biopsy 12/23/13 which did not show cancer.  Lymph node was negative as well.  Pt has follow imaging scheduled for 6/16.    Patient's last menstrual period was 01/22/2011.          Sexually active: Yes.    The current method of family planning is tubal ligation.     Pre-procedure vitals: Blood pressure 142/88, pulse 64, resp. rate 16, weight 309 lb (140.161 kg), last menstrual period 01/22/2011.   Procedure explained and patient's questions were invited and answered.   Consent form signed.  Procedure Set-up: Grounding pad located left thigh.  Cautery settings: 55 cut/55 coagulation.  Suction applied to coated speculum.  Procedure:  Speculum placed with good visualization of the cervix.  Colposcopy performed showing:  visible lesion(s) at 11 o'clock.  Cervix anesthetized using 2% Xylocaine with 1:100,000units Epinephrine.  8 cc's used.  Entire transition zone excised with 12 x 15 loop in 1 pass.  Additional deeper specimen obtained with 10 x 12 loop and additional 11 o'clock specimen Specimen(s) placed on cork and labeled for pathology.  Hemostasis obtained with ball cautery and Monsel's solution.  EBL:  less than 50 mL  Complications:  none  Patient tolerated procedure well and left the office in satisfactory condition.  Plan:  After visit summary given.  Repeat pap planned in 6 months.

## 2014-02-04 LAB — IPS OTHER TISSUE BIOPSY

## 2014-02-09 ENCOUNTER — Telehealth: Payer: Self-pay | Admitting: Emergency Medicine

## 2014-02-09 NOTE — Telephone Encounter (Signed)
Spoke with patient. Message from Dr. Sabra Heck given and questions answered. Emphasized importance of follow up in 6 months. Patient agreeable. Patient declines to schedule at this time. States she will call back to schedule this appointment for 6 month pap.  Recall entered.  Routing to provider for final review. Patient agreeable to disposition. Will close encounter

## 2014-02-09 NOTE — Telephone Encounter (Signed)
-----   Message from Lyman Speller, MD sent at 02/04/2014  5:34 PM EST ----- Please inform pt there was both high and low grade dysplasia in pathology report.  The pathologist could not clearly say if margins were negative.  Needs repeat Pap 6 months.  06 recall please.

## 2014-03-07 ENCOUNTER — Telehealth: Payer: Self-pay | Admitting: Emergency Medicine

## 2014-03-07 NOTE — Telephone Encounter (Signed)
-----   Message from Lyman Speller, MD sent at 03/04/2014  6:30 PM EST ----- Regarding: RE: Mammogram hold  Yes on both accounts.  MSM ----- Message -----    From: Karen Chafe, RN    Sent: 03/04/2014   1:57 PM      To: Lyman Speller, MD Subject: Mammogram hold                                 Dr. Sabra Heck, okay to remove from mammogram hold? Patient had Breast biopsy completed and LEEP in our office as well.

## 2014-04-09 ENCOUNTER — Other Ambulatory Visit: Payer: Self-pay | Admitting: Internal Medicine

## 2014-04-12 ENCOUNTER — Other Ambulatory Visit: Payer: Self-pay | Admitting: Internal Medicine

## 2014-04-14 ENCOUNTER — Other Ambulatory Visit: Payer: Self-pay | Admitting: Internal Medicine

## 2014-05-02 ENCOUNTER — Ambulatory Visit: Payer: BC Managed Care – PPO | Admitting: Neurology

## 2014-05-03 ENCOUNTER — Encounter: Payer: Self-pay | Admitting: Neurology

## 2014-07-15 ENCOUNTER — Encounter: Payer: Self-pay | Admitting: Obstetrics & Gynecology

## 2014-07-15 ENCOUNTER — Ambulatory Visit: Payer: BLUE CROSS/BLUE SHIELD | Admitting: Obstetrics & Gynecology

## 2014-07-18 ENCOUNTER — Encounter: Payer: Self-pay | Admitting: Obstetrics & Gynecology

## 2014-07-26 ENCOUNTER — Encounter: Payer: Self-pay | Admitting: Obstetrics & Gynecology

## 2014-07-26 ENCOUNTER — Telehealth: Payer: Self-pay | Admitting: Obstetrics & Gynecology

## 2014-07-27 ENCOUNTER — Telehealth: Payer: Self-pay

## 2014-07-27 NOTE — Telephone Encounter (Signed)
Lmtcb, did not leave details-DPR does not give permission. Needs to schedule AEX, in pap recall. Was going to offer 07/29/14 appointment if it is still open. No show letter has been sent as well.//kn

## 2014-07-29 NOTE — Telephone Encounter (Signed)
I think we should send this pt a 30 day letter.  She has hx of sever cervical dysplasia with + margins.  She is also the pt with the abnormal breast finding that w/u was eventually negative. She is due a follow up 6 month pap smear and no showed her appt.  Thanks.

## 2014-08-01 NOTE — Telephone Encounter (Signed)
Gay Filler, I am routing to you for 30 day letter.//kn

## 2014-09-01 ENCOUNTER — Encounter: Payer: Self-pay | Admitting: *Deleted

## 2014-09-01 NOTE — Telephone Encounter (Signed)
Letter to your office for review. 

## 2014-09-02 NOTE — Telephone Encounter (Signed)
Letter reviewed and signed by Dr Sabra Heck. Mailed certified and regular [place] mail.

## 2014-09-22 ENCOUNTER — Encounter: Payer: Self-pay | Admitting: Obstetrics & Gynecology

## 2016-04-26 ENCOUNTER — Encounter (HOSPITAL_COMMUNITY): Payer: Self-pay | Admitting: *Deleted

## 2019-12-15 DIAGNOSIS — H3563 Retinal hemorrhage, bilateral: Secondary | ICD-10-CM | POA: Diagnosis not present

## 2019-12-15 DIAGNOSIS — H35033 Hypertensive retinopathy, bilateral: Secondary | ICD-10-CM | POA: Diagnosis not present

## 2019-12-25 ENCOUNTER — Other Ambulatory Visit: Payer: Self-pay

## 2019-12-25 ENCOUNTER — Emergency Department (HOSPITAL_COMMUNITY)
Admission: EM | Admit: 2019-12-25 | Discharge: 2019-12-25 | Disposition: A | Discharge status: Home / Self Care | Payer: BC Managed Care – PPO | Attending: Emergency Medicine | Admitting: Emergency Medicine

## 2019-12-25 ENCOUNTER — Encounter (HOSPITAL_COMMUNITY): Payer: Self-pay | Admitting: *Deleted

## 2019-12-25 DIAGNOSIS — Z87891 Personal history of nicotine dependence: Secondary | ICD-10-CM | POA: Insufficient documentation

## 2019-12-25 DIAGNOSIS — H538 Other visual disturbances: Secondary | ICD-10-CM | POA: Diagnosis not present

## 2019-12-25 DIAGNOSIS — Z20822 Contact with and (suspected) exposure to covid-19: Secondary | ICD-10-CM | POA: Insufficient documentation

## 2019-12-25 DIAGNOSIS — Z8673 Personal history of transient ischemic attack (TIA), and cerebral infarction without residual deficits: Secondary | ICD-10-CM | POA: Insufficient documentation

## 2019-12-25 DIAGNOSIS — I161 Hypertensive emergency: Secondary | ICD-10-CM | POA: Insufficient documentation

## 2019-12-25 DIAGNOSIS — Z7982 Long term (current) use of aspirin: Secondary | ICD-10-CM | POA: Insufficient documentation

## 2019-12-25 DIAGNOSIS — H539 Unspecified visual disturbance: Secondary | ICD-10-CM

## 2019-12-25 DIAGNOSIS — Z853 Personal history of malignant neoplasm of breast: Secondary | ICD-10-CM | POA: Diagnosis not present

## 2019-12-25 LAB — CBC
HCT: 46.8 % — ABNORMAL HIGH (ref 36.0–46.0)
Hemoglobin: 14.5 g/dL (ref 12.0–15.0)
MCH: 25.7 pg — ABNORMAL LOW (ref 26.0–34.0)
MCHC: 31 g/dL (ref 30.0–36.0)
MCV: 83 fL (ref 80.0–100.0)
Platelets: 192 10*3/uL (ref 150–400)
RBC: 5.64 MIL/uL — ABNORMAL HIGH (ref 3.87–5.11)
RDW: 13.7 % (ref 11.5–15.5)
WBC: 3.6 10*3/uL — ABNORMAL LOW (ref 4.0–10.5)
nRBC: 0 % (ref 0.0–0.2)

## 2019-12-25 LAB — COMPREHENSIVE METABOLIC PANEL
ALT: 20 U/L (ref 0–44)
AST: 15 U/L (ref 15–41)
Albumin: 4.3 g/dL (ref 3.5–5.0)
Alkaline Phosphatase: 64 U/L (ref 38–126)
Anion gap: 7 (ref 5–15)
BUN: 17 mg/dL (ref 8–23)
CO2: 24 mmol/L (ref 22–32)
Calcium: 9.7 mg/dL (ref 8.9–10.3)
Chloride: 108 mmol/L (ref 98–111)
Creatinine, Ser: 0.88 mg/dL (ref 0.44–1.00)
GFR, Estimated: >60 mL/min (ref >60)
Glucose, Bld: 98 mg/dL (ref 70–99)
Potassium: 4.1 mmol/L (ref 3.5–5.1)
Sodium: 139 mmol/L (ref 135–145)
Total Bilirubin: 0.6 mg/dL (ref 0.3–1.2)
Total Protein: 7.8 g/dL (ref 6.5–8.1)

## 2019-12-25 LAB — RAPID URINE DRUG SCREEN, HOSP PERFORMED
Amphetamines: NOT DETECTED
Barbiturates: NOT DETECTED
Benzodiazepines: NOT DETECTED
Cocaine: NOT DETECTED
Opiates: NOT DETECTED
Tetrahydrocannabinol: NOT DETECTED

## 2019-12-25 LAB — DIFFERENTIAL
Abs Immature Granulocytes: 0.01 10*3/uL (ref 0.00–0.07)
Basophils Absolute: 0 10*3/uL (ref 0.0–0.1)
Basophils Relative: 1 %
Eosinophils Absolute: 0.1 10*3/uL (ref 0.0–0.5)
Eosinophils Relative: 3 %
Immature Granulocytes: 0 %
Lymphocytes Relative: 46 %
Lymphs Abs: 1.7 10*3/uL (ref 0.7–4.0)
Monocytes Absolute: 0.3 10*3/uL (ref 0.1–1.0)
Monocytes Relative: 9 %
Neutro Abs: 1.5 10*3/uL — ABNORMAL LOW (ref 1.7–7.7)
Neutrophils Relative %: 41 %

## 2019-12-25 LAB — I-STAT CHEM 8, ED
BUN: 17 mg/dL (ref 8–23)
Calcium, Ion: 1.19 mmol/L (ref 1.15–1.40)
Chloride: 108 mmol/L (ref 98–111)
Creatinine, Ser: 0.9 mg/dL (ref 0.44–1.00)
Glucose, Bld: 98 mg/dL (ref 70–99)
HCT: 44 % (ref 36.0–46.0)
Hemoglobin: 15 g/dL (ref 12.0–15.0)
Potassium: 4.1 mmol/L (ref 3.5–5.1)
Sodium: 141 mmol/L (ref 135–145)
TCO2: 25 mmol/L (ref 22–32)

## 2019-12-25 LAB — URINALYSIS, ROUTINE W REFLEX MICROSCOPIC
Bilirubin Urine: NEGATIVE
Glucose, UA: NEGATIVE mg/dL
Hgb urine dipstick: NEGATIVE
Ketones, ur: NEGATIVE mg/dL
Leukocytes,Ua: NEGATIVE
Nitrite: NEGATIVE
Protein, ur: NEGATIVE mg/dL
Specific Gravity, Urine: 1.009 (ref 1.005–1.030)
pH: 8 (ref 5.0–8.0)

## 2019-12-25 LAB — RESP PANEL BY RT-PCR (FLU A&B, COVID) ARPGX2
Influenza A by PCR: NEGATIVE
Influenza B by PCR: NEGATIVE
SARS Coronavirus 2 by RT PCR: NEGATIVE

## 2019-12-25 LAB — PROTIME-INR
INR: 1 (ref 0.8–1.2)
Prothrombin Time: 12.7 seconds (ref 11.4–15.2)

## 2019-12-25 LAB — APTT: aPTT: 27 seconds (ref 24–36)

## 2019-12-25 MED ORDER — HYDROCHLOROTHIAZIDE 25 MG PO TABS
25.0000 mg | ORAL_TABLET | Freq: Every day | ORAL | 2 refills | Status: DC
Start: 2019-12-25 — End: 2020-03-28

## 2019-12-25 MED ORDER — SIMVASTATIN 20 MG PO TABS
20.0000 mg | ORAL_TABLET | Freq: Every day | ORAL | 2 refills | Status: DC
Start: 2019-12-25 — End: 2020-03-09

## 2019-12-25 MED ORDER — HYDRALAZINE HCL 20 MG/ML IJ SOLN
10.0000 mg | INTRAMUSCULAR | Status: AC
  Administered 2019-12-25: 10 mg via INTRAVENOUS
  Filled 2019-12-25: qty 1

## 2019-12-25 MED ORDER — SODIUM CHLORIDE 0.9 % IV BOLUS
500.0000 mL | Freq: Once | INTRAVENOUS | Status: AC
  Administered 2019-12-25: 500 mL via INTRAVENOUS

## 2019-12-25 MED ORDER — NICARDIPINE HCL IN NACL 20-0.86 MG/200ML-% IV SOLN: 3.0000 mg/h | INTRAVENOUS | Status: DC

## 2019-12-25 MED ORDER — LISINOPRIL 20 MG PO TABS: 20.0000 mg | ORAL_TABLET | Freq: Every day | ORAL | 2 refills | Status: DC

## 2019-12-25 MED ORDER — SODIUM CHLORIDE 0.9 % IV SOLN: 100.0000 mL/h | INTRAVENOUS | Status: DC

## 2019-12-25 MED ORDER — ASPIRIN 81 MG PO CHEW: 81.0000 mg | CHEWABLE_TABLET | Freq: Every day | ORAL | 2 refills | Status: DC

## 2019-12-25 NOTE — ED Provider Notes (Signed)
[facility] DEPT Provider Note   CSN: [id number] Arrival date & time: 12/25/19  1048     History Chief Complaint  Patient presents with  . Hypertension  . Blurred Vision    Leslie Graves is a 59 y.o. female.  HPI  Patient presents with her husband who assists with the HPI. Patient has a history of hypertension, but has no ongoing medication use.  She last took medication for her blood pressure several years ago.  It is unclear why she stopped, though she does note that she had some consideration of one of her antihypertensives being linked to a carcinogen. She notes that 2 days ago she began having blurry vision in her left eye, without complete loss.  No other new changes including headache, chest pain, dyspnea, weakness in any extremity, discoordination. According to husband, no other notable changes either. Patient has recently noticed her blood pressure has been elevated, and has continued to be so over the past few days. Today she went to urgent care given this, addition of concerns and was sent here for further evaluation.   Past Medical History:  Diagnosis Date  . Anemia   . Breast cancer ([place])    19 years ago-left breast mastectomy-no chemo or radiation  . High cholesterol   . Hypertension   . RhD negative   . Stroke [facility]) 7/15   also had mini strokes    Patient Active Problem List   Diagnosis Date Noted  . Abnormal cervical Papanicolaou smear 10/22/2013  . Breast lump 10/22/2013  . H/O stroke within last year 09/24/2013  . Lacunar infarct, acute ([place]) 08/26/2013  . Other generalized ischemic cerebrovascular disease 08/26/2013  . Essential familial hyperlipidemia 08/26/2013  . Morbid obesity ([place]) 08/26/2013  . Numerous moles 08/26/2013  . Essential (primary) hypertension 08/18/2013  . Personal history of malignant neoplasm of breast 08/18/2013  . Nausea and vomiting 08/08/2013  . N&V (nausea and vomiting) 08/07/2013  .  Hypertensive urgency 08/07/2013  . Dysconjugate gaze 08/07/2013    Past Surgical History:  Procedure Laterality Date  . LAPAROSCOPIC CHOLECYSTECTOMY  2008  . MASTECTOMY Left age 32   Dr Edison Pace, [place]  . TUBAL LIGATION       OB History    Gravida  6   Para  4   Term      Preterm      AB  1   Living  4     SAB  1   TAB      Ectopic      Multiple      Live Births              Family History  Problem Relation Age of Onset  . Cancer Mother        pancreatic  . Hypertension Mother   . Heart attack Father   . Hypertension Daughter   . Hypertension Son     Social History   Tobacco Use  . Smoking status: Former Research scientist (life sciences)  . Smokeless tobacco: Never Used  . Tobacco comment: 15-20 years ago  Substance Use Topics  . Alcohol use: No  . Drug use: No    Home Medications Prior to Admission medications   Medication Sig Start Date End Date Taking? Authorizing Provider  acetaminophen (TYLENOL) 325 MG tablet Take 650 mg by mouth every 6 (six) hours as needed for mild pain, fever or headache.   Yes [provider]  aspirin EC 81 MG EC tablet Take  1 tablet (81 mg total) by mouth daily. 08/12/13  Yes Reyne Dumas, MD  Multiple Vitamins-Minerals (PRESERVISION AREDS PO) Take 1 capsule by mouth daily.   Yes [provider]  aspirin 81 MG chewable tablet Chew 1 tablet (81 mg total) by mouth daily. 12/25/19   Carmin Muskrat, MD  hydrochlorothiazide (HYDRODIURIL) 25 MG tablet Take 1 tablet (25 mg total) by mouth daily. 12/25/19   Carmin Muskrat, MD  lisinopril (ZESTRIL) 20 MG tablet Take 1 tablet (20 mg total) by mouth daily. 12/25/19   Carmin Muskrat, MD  simvastatin (ZOCOR) 20 MG tablet Take 1 tablet (20 mg total) by mouth daily at 6 PM. 12/25/19   Carmin Muskrat, MD    Allergies    Other  Review of Systems   Review of Systems  Constitutional:       Per HPI, otherwise negative  HENT:       Per HPI, otherwise negative  Eyes: Positive for visual  disturbance. Negative for pain.  Respiratory:       Per HPI, otherwise negative  Cardiovascular:       Per HPI, otherwise negative  Gastrointestinal: Negative for vomiting.  Endocrine:       Negative aside from HPI  Genitourinary:       Neg aside from HPI   Musculoskeletal:       Per HPI, otherwise negative  Skin: Negative.   Neurological: Negative for syncope.    Physical Exam Updated Vital Signs BP (!) 198/137   Pulse 86   Temp 98 F (36.7 C) (Oral)   Resp 20   Ht 5\' 4"  (1.626 m)   Wt 136.1 kg   LMP 01/22/2011   SpO2 100%   BMI 51.49 kg/m   Physical Exam Vitals and nursing note reviewed.  Constitutional:      Appearance: She is well-developed. She is obese.  HENT:     Head: Normocephalic and atraumatic.  Eyes:     Conjunctiva/sclera: Conjunctivae normal.     Comments: Right eye has appropriate pupillary response, left eye with diminished constriction.  Otherwise eye exam unremarkable.  Cardiovascular:     Rate and Rhythm: Normal rate and regular rhythm.  Pulmonary:     Effort: Pulmonary effort is normal. No respiratory distress.     Breath sounds: Normal breath sounds. No stridor.  Abdominal:     General: There is no distension.  Skin:    General: Skin is warm and dry.  Neurological:     General: No focal deficit present.     Mental Status: She is alert and oriented to person, place, and time.     Cranial Nerves: No cranial nerve deficit.     Motor: No weakness.     Coordination: Coordination normal.  Psychiatric:        Mood and Affect: Mood normal.        Behavior: Behavior normal.      ED Results / Procedures / Treatments   Labs (all labs ordered are listed, but only abnormal results are displayed) Labs Reviewed  CBC - Abnormal; Notable for the following components:      Result Value   WBC 3.6 (*)    RBC 5.64 (*)    HCT 46.8 (*)    MCH 25.7 (*)    All other components within normal limits  DIFFERENTIAL - Abnormal; Notable for the following  components:   Neutro Abs 1.5 (*)    All other components within normal limits  URINALYSIS, ROUTINE W  REFLEX MICROSCOPIC - Abnormal; Notable for the following components:   Color, Urine STRAW (*)    All other components within normal limits  RESP PANEL BY RT-PCR (FLU A&B, COVID) ARPGX2  PROTIME-INR  APTT  COMPREHENSIVE METABOLIC PANEL  RAPID URINE DRUG SCREEN, HOSP PERFORMED  I-STAT CHEM 8, ED    EKG EKG Interpretation  Date/Time:  Saturday December 25 2019 13:46:14 EST Ventricular Rate:  74 PR Interval:    QRS Duration: 136 QT Interval:  400 QTC Calculation: 444 R Axis:   79 Text Interpretation: Sinus rhythm LAE, consider biatrial enlargement IVCD, consider atypical RBBB Left ventricular hypertrophy Artifact Abnormal ECG Confirmed by Carmin Muskrat [id number]) on 12/25/2019 3:00:02 PM   Radiology No results found.  Procedures Procedures (including critical care time)  Medications Ordered in ED Medications  sodium chloride 0.9 % bolus 500 mL (0 mLs Intravenous Stopped 12/25/19 1511)    Followed by  0.9 %  sodium chloride infusion (has no administration in time range)  nicardipine (CARDENE) 20mg  in 0.86% saline 29ml IV infusion (0.1 mg/ml) (0 mg/hr Intravenous Hold 12/25/19 1608)  hydrALAZINE (APRESOLINE) injection 10 mg (10 mg Intravenous Given 12/25/19 1412)    ED Course  I have reviewed the triage vital signs and the nursing notes.  Pertinent labs & imaging results that were available during my care of the patient were reviewed by me and considered in my medical decision making (see chart for details).   With consideration of hypertensive emergency given new blurry vision, in the context of seemingly uncontrolled hypertension patient had broad labs sent, MRI ordered, and IV hydralazine initiated for blood pressure control.  When she was placed on continuous cardiac monitoring, pulse oximetry. MDM Rules/Calculators/A&P                          5:02 PM Patient awake,  alert, blood pressure now approximately 170/110, though there is been difficulty in registering consistent values, likely secondary to the patient's obesity, cuff placement. She, her husband and I had a very lengthy conversation at today's evaluation.  Labs generally unremarkable, no hypoxia, no evidence for infection.  Patient's presentation most concerning for hypertensive emergency, and with need to rule out stroke medical recommendations for MRI, ongoing blood pressure management, possible admission.  Patient is adamant that she does not want to be transferred to our affiliated facility for MRI, further evaluation, states that she had a prior bad experience there. She acknowledges that with her history of prior abnormal MRI stroke not being conclusively excluded without additional imaging, evaluation.  However, patient is adamant she does not want to proceed to our facility for that study.  She is however, amenable to restarting multiple home medications, including aspirin, following up with our neurology colleagues as an outpatient, and also acknowledges importance of return precautions, as does her husband.  MDM Number of Diagnoses or Management Options Hypertensive emergency: new, needed workup Visual disturbance: new, needed workup   Amount and/or Complexity of Data Reviewed Clinical lab tests: reviewed Tests in the medicine section of CPT: reviewed Decide to obtain previous medical records or to obtain history from someone other than the patient: yes Obtain history from someone other than the patient: yes Review and summarize past medical records: yes Independent visualization of images, tracings, or specimens: yes  Risk of Complications, Morbidity, and/or Mortality Presenting problems: high Diagnostic procedures: high Management options: high  Critical Care Total time providing critical care: < 30 minutes  Patient Progress  Patient progress: stable   Final Clinical  Impression(s) / ED Diagnoses Final diagnoses:  Hypertensive emergency  Visual disturbance    Rx / DC Orders ED Discharge Orders         Ordered    simvastatin (ZOCOR) 20 MG tablet  Daily-1800        12/25/19 1701    hydrochlorothiazide (HYDRODIURIL) 25 MG tablet  Daily        12/25/19 1701    aspirin 81 MG chewable tablet  Daily        12/25/19 1701    lisinopril (ZESTRIL) 20 MG tablet  Daily        12/25/19 1701           Carmin Muskrat, MD 12/25/19 1705

## 2019-12-25 NOTE — ED Notes (Signed)
MD at bedside. 

## 2019-12-25 NOTE — Discharge Instructions (Signed)
As discussed, today's evaluation has been somewhat reassuring, but it is important that you follow-up with our outpatient neurology colleagues given your history of abnormal MRI, and today's ED visit for hypertension and blurry vision.  Please be sure to take all medication as directed. As discussed, if you develop new, or concerning changes, it is very important you return to an emergency department, preferably [facility] for further evaluation and management.

## 2019-12-25 NOTE — ED Notes (Signed)
Patient refusing transfer to [facility] for MRI. MD made aware.

## 2019-12-25 NOTE — ED Triage Notes (Signed)
Pt presents with HTN and blurred vision. bp checked several times in triage 259/129 -72-100% She is not on meds. Recent eye examine due to blurred vision.

## 2020-01-07 DIAGNOSIS — H35033 Hypertensive retinopathy, bilateral: Secondary | ICD-10-CM | POA: Diagnosis not present

## 2020-01-07 DIAGNOSIS — H43821 Vitreomacular adhesion, right eye: Secondary | ICD-10-CM | POA: Diagnosis not present

## 2020-01-07 DIAGNOSIS — H34232 Retinal artery branch occlusion, left eye: Secondary | ICD-10-CM | POA: Diagnosis not present

## 2020-01-07 DIAGNOSIS — H35361 Drusen (degenerative) of macula, right eye: Secondary | ICD-10-CM | POA: Diagnosis not present

## 2020-01-11 ENCOUNTER — Telehealth: Payer: Self-pay | Admitting: Neurology

## 2020-01-11 NOTE — Telephone Encounter (Signed)
Put her on my schedule for 9am tomorrow Dec 22.

## 2020-01-11 NOTE — Telephone Encounter (Signed)
The patient is rescheduled with Dr Krista Blue at 2:30 on December 22

## 2020-01-11 NOTE — Telephone Encounter (Signed)
Does the patient need to be seen this week, or can we speak with Dr Leonie Man about a sooner appointment when he is back in the office next week?

## 2020-01-11 NOTE — Telephone Encounter (Signed)
I was able to talk with his retinal specialist Dr. Jule Ser, patient presented with acute loss of vision in left eye 2 weeks ago, was diagnosed with left retinal artery occlusion, hope to be seen sooner to complete vascular work-up  Chart reviewed, she was seen by Dr. Theresia Lo in 2015 for small vessel disease, multiple vascular risk factor, on aspirin 81 mg daily, is on schedule with Dr. Leonie Man on January 26.  Please move up her appointment with Dr. Leonie Man as soon as possible, may even consider change to a different provider if needed

## 2020-01-12 ENCOUNTER — Ambulatory Visit: Payer: BC Managed Care – PPO | Admitting: Neurology

## 2020-01-12 ENCOUNTER — Other Ambulatory Visit: Payer: Self-pay

## 2020-01-12 ENCOUNTER — Encounter: Payer: Self-pay | Admitting: Neurology

## 2020-01-12 VITALS — BP 183/92 | HR 68 | Ht 64.0 in | Wt 309.0 lb

## 2020-01-12 DIAGNOSIS — I639 Cerebral infarction, unspecified: Secondary | ICD-10-CM

## 2020-01-12 DIAGNOSIS — H34232 Retinal artery branch occlusion, left eye: Secondary | ICD-10-CM | POA: Insufficient documentation

## 2020-01-12 MED ORDER — CLOPIDOGREL BISULFATE 75 MG PO TABS: 75.0000 mg | ORAL_TABLET | Freq: Every day | ORAL | 11 refills | Status: DC

## 2020-01-12 NOTE — Progress Notes (Addendum)
Chief Complaint  Patient presents with  . New Patient (Initial Visit)    She is here with her daughter, Sharyn Lull. Hx of stroke. Reports sudden vision loss on 12/22/19. She was seen in ED for hypertension on 12/25/19. Recent abnormal eye exam. She was unable to see eye chart w/ left eye today. Corrected vision in right eye was 20/20-1. She has been taking aspirin 81mg  daily for years.    HISTORICAL  Leslie Graves is a 59 year old female, accompanied by her daughter Sharyn Lull, seen in request by her ophthalmologist Dr. Princess Bruins and her primary care nurse practitioner Vicenta Aly for evaluation of sudden onset of left visual loss on December 22, 2019  I reviewed and summarized the referring note.  Past medical history Hypertension Hyperlipidemia Stroke Breast cancer, status post left mastectomy, Obesity  She was seen by Dr. Leonie Man in the past, presented with sudden onset nausea vomiting on August 07, 2013, with elevated blood pressure, systolic above 297, transient disconjugate eye movement,  MRI of the brain showed weak diffuse positive DWI lesion at left middle cerebellar peduncle, and primary lesion in corpus callosum, subcortical white matter,  Initially there was a concern of multiple sclerosis, MRI cervical spine showed no definite MS plaques  Echocardiogram normal ejection fraction, ultrasound of carotid artery showed no large vessel disease, LDL was elevated to 136, she was treated with aspirin 81 mg daily, and statin Zocor 20 mg daily, blood pressure medication  Her symptoms quickly resolved, think her abnormal MRI findings is most due to hypertensive urgency, small vessel disease,  On Dec 22 2019, when she first got of bed, her vision was normal, around 10 AM, while she was about leaving her house, she suddenly had blurry vision, reached its peak in couple minutes, when she calls her right eye, she realized that it was her left eye causing trouble, she can only see light, but  no detail,  She denies headache, no lateralized motor or sensory deficit  She was initially evaluated by optometrist, later referred to ophthalmologist Dr.Govind on January 11, 2020, was diagnosed with left retinal artery occlusion, over the past 3 weeks, she has essentially no recovery of her vision, she can see black-and-white shadow at her left peripheral vision,   REVIEW OF SYSTEMS: Full 14 system review of systems performed and notable only for above All other review of systems were negative.  ALLERGIES: Allergies  Allergen Reactions  . Other Nausea And Vomiting    CT dye    HOME MEDICATIONS: Current Outpatient Medications  Medication Sig Dispense Refill  . acetaminophen (TYLENOL) 325 MG tablet Take 650 mg by mouth every 6 (six) hours as needed for mild pain, fever or headache.    Marland Kitchen aspirin 81 MG chewable tablet Chew 1 tablet (81 mg total) by mouth daily. 30 tablet 2  . hydrochlorothiazide (HYDRODIURIL) 25 MG tablet Take 1 tablet (25 mg total) by mouth daily. 30 tablet 2  . lisinopril (ZESTRIL) 20 MG tablet Take 1 tablet (20 mg total) by mouth daily. 30 tablet 2  . Multiple Vitamins-Minerals (PRESERVISION AREDS PO) Take 1 capsule by mouth daily.    . simvastatin (ZOCOR) 20 MG tablet Take 1 tablet (20 mg total) by mouth daily at 6 PM. 30 tablet 2   No current facility-administered medications for this visit.    PAST MEDICAL HISTORY: Past Medical History:  Diagnosis Date  . Anemia   . Breast cancer ([place])    19 years ago-left breast mastectomy-no chemo or radiation  .  High cholesterol   . Hypertension   . RhD negative   . Stroke ([place]) 7/15   also had mini strokes  . Vision loss of left eye     PAST SURGICAL HISTORY: Past Surgical History:  Procedure Laterality Date  . LAPAROSCOPIC CHOLECYSTECTOMY  2008  . MASTECTOMY Left age 12   Dr Edison Pace, [place]  . TUBAL LIGATION      FAMILY HISTORY: Family History  Problem Relation Age of Onset  . Cancer Mother         pancreatic  . Hypertension Mother   . Heart attack Father   . Hypertension Daughter   . Hypertension Son     SOCIAL HISTORY: Social History   Socioeconomic History  . Marital status: Married    Spouse name: Not on file  . Number of children: 4  . Years of education: MA  . Highest education level: Not on file  Occupational History    Employer: [employer]  Tobacco Use  . Smoking status: Former Research scientist (life sciences)  . Smokeless tobacco: Never Used  . Tobacco comment: 15-20 years ago  Substance and Sexual Activity  . Alcohol use: No  . Drug use: No  . Sexual activity: Yes    Partners: Male  Other Topics Concern  . Not on file  Social History Narrative   Lives at home with her husband.   Right-handed.   Caffeine use - 1-1.5 cups coffee per day.   Social Determinants of Health   Financial Resource Strain: Not on file  Food Insecurity: Not on file  Transportation Needs: Not on file  Physical Activity: Not on file  Stress: Not on file  Social Connections: Not on file  Intimate Partner Violence: Not on file     PHYSICAL EXAM   Vitals:   01/12/20 1430  BP: (!) 183/92  Pulse: 68  Weight: (!) 309 lb (140.2 kg)  Height: 5\' 4"  (1.626 m)   Not recorded     Body mass index is 53.04 kg/m.  PHYSICAL EXAMNIATION:  Gen: NAD, conversant, well nourised, well groomed                     Cardiovascular: Regular rate rhythm, no peripheral edema, warm, nontender. Eyes: Conjunctivae clear without exudates or hemorrhage Neck: Supple, no carotid bruits. Pulmonary: Clear to auscultation bilaterally   NEUROLOGICAL EXAM:  MENTAL STATUS: Speech:    Speech is normal; fluent and spontaneous with normal comprehension.  Cognition:     Orientation to time, place and person     Normal recent and remote memory     Normal Attention span and concentration     Normal Language, naming, repeating,spontaneous speech     Fund of knowledge   CRANIAL NERVES: CN II: Visual fields are full to  confrontation.  Left afferent pupillary defect CN III, IV, VI: extraocular movement are normal. No ptosis. CN V: Facial sensation is intact to light touch CN VII: Face is symmetric with normal eye closure  CN VIII: Hearing is normal to causal conversation. CN IX, X: Phonation is normal. CN XI: Head turning and shoulder shrug are intact  MOTOR: There is no pronator drift of out-stretched arms. Muscle bulk and tone are normal. Muscle strength is normal.  REFLEXES: Reflexes are 2+ and symmetric at the biceps, triceps, knees, and ankles. Plantar responses are flexor.  SENSORY: Intact to light touch, pinprick and vibratory sensation are intact in fingers and toes.  COORDINATION: There is no trunk or limb  dysmetria noted.  GAIT/STANCE: Posture is normal. Gait is steady with normal steps, base, arm swing, and turning.  Limited by her big body habitus  DIAGNOSTIC DATA (LABS, IMAGING, TESTING) - I reviewed patient records, labs, notes, testing and imaging myself where available.   ASSESSMENT AND PLAN  Leslie Graves is a 59 y.o. female   Left retinal artery occlusion on January 11, 2020 History of small vessel stroke in 2015  Vascular risk factor of obesity, hypertension, lost to follow-up with her primary care physician for more than 4 years  Stop aspirin 81 mg, start Plavix 75 mg daily  Laboratory evaluation for vascular risk factor stratification  MRI of brain, MRA of brain and neck  Echocardiogram  Return to clinic in 2 to 3 months with Judson Roch, will call her report in between   Marcial Pacas, M.D. Ph.D.  [facility] [address] [place], [place] [address] Ph: [phone] Fax: [phone]  CC:  Vicenta Aly, [place],  [place] [address]  Princess Bruins, MD   ,

## 2020-01-13 ENCOUNTER — Telehealth: Payer: Self-pay | Admitting: Neurology

## 2020-01-13 ENCOUNTER — Other Ambulatory Visit (INDEPENDENT_AMBULATORY_CARE_PROVIDER_SITE_OTHER): Payer: Self-pay

## 2020-01-13 DIAGNOSIS — Z0289 Encounter for other administrative examinations: Secondary | ICD-10-CM

## 2020-01-13 DIAGNOSIS — H34232 Retinal artery branch occlusion, left eye: Secondary | ICD-10-CM | POA: Diagnosis not present

## 2020-01-13 DIAGNOSIS — I639 Cerebral infarction, unspecified: Secondary | ICD-10-CM | POA: Diagnosis not present

## 2020-01-13 NOTE — Telephone Encounter (Signed)
Pending Blue [employer] of [employer] for Benefits and approval Sent via Fax per request Fax  [phone] Telephone [phone] or [phone]  Jayme Cloud  01/13/2020  MR Brain MR Neck  MR Head  Echo

## 2020-01-14 LAB — LIPID PANEL
Chol/HDL Ratio: 2.8 ratio (ref 0.0–4.4)
Cholesterol, Total: 170 mg/dL (ref 100–199)
HDL: 60 mg/dL (ref >39)
LDL Chol Calc (NIH): 92 mg/dL (ref 0–99)
Triglycerides: 99 mg/dL (ref 0–149)
VLDL Cholesterol Cal: 18 mg/dL (ref 5–40)

## 2020-01-18 ENCOUNTER — Telehealth: Payer: Self-pay | Admitting: Neurology

## 2020-01-18 NOTE — Telephone Encounter (Signed)
Aim Denied MR Angio Head and MR Angio Neck . Spoke to [employer] . Dr. Terrace Arabia and Marcelino Duster do you want to start appeal this could take up to 180 days ?  [phone] . 01/18/2020  Echo was approved also . Patient does want to have MRI [name] and Echo Until after the first of the Year [name]

## 2020-01-28 ENCOUNTER — Other Ambulatory Visit (INDEPENDENT_AMBULATORY_CARE_PROVIDER_SITE_OTHER): Payer: Self-pay

## 2020-01-28 DIAGNOSIS — H35361 Drusen (degenerative) of macula, right eye: Secondary | ICD-10-CM | POA: Diagnosis not present

## 2020-01-28 DIAGNOSIS — H35033 Hypertensive retinopathy, bilateral: Secondary | ICD-10-CM | POA: Diagnosis not present

## 2020-01-28 DIAGNOSIS — H34232 Retinal artery branch occlusion, left eye: Secondary | ICD-10-CM | POA: Diagnosis not present

## 2020-01-28 DIAGNOSIS — Z0289 Encounter for other administrative examinations: Secondary | ICD-10-CM

## 2020-01-28 DIAGNOSIS — H43821 Vitreomacular adhesion, right eye: Secondary | ICD-10-CM | POA: Diagnosis not present

## 2020-01-29 LAB — SEDIMENTATION RATE: Sed Rate: 35 mm/hr (ref 0–40)

## 2020-01-29 LAB — VITAMIN B12: Vitamin B-12: 1456 pg/mL — ABNORMAL HIGH (ref 232–1245)

## 2020-01-29 LAB — ANA W/REFLEX IF POSITIVE: Anti Nuclear Antibody (ANA): NEGATIVE

## 2020-01-29 LAB — FOLATE: Folate: >20 ng/mL (ref >3.0)

## 2020-01-29 LAB — RPR: RPR Ser Ql: NONREACTIVE

## 2020-01-29 LAB — TSH: TSH: 1.56 u[IU]/mL (ref 0.450–4.500)

## 2020-01-29 LAB — HIV ANTIBODY (ROUTINE TESTING W REFLEX): HIV Screen 4th Generation wRfx: NONREACTIVE

## 2020-01-29 LAB — C-REACTIVE PROTEIN: CRP: 14 mg/L — ABNORMAL HIGH (ref 0–10)

## 2020-01-29 LAB — CK: Total CK: 95 U/L (ref 32–182)

## 2020-01-29 LAB — HGB A1C W/O EAG: Hgb A1c MFr Bld: 5.7 % — ABNORMAL HIGH (ref 4.8–5.6)

## 2020-01-31 NOTE — Telephone Encounter (Signed)
She needs to have MRI of brain, MRA of brain and neck for stroke stratification.  Please start appeal

## 2020-02-01 NOTE — Telephone Encounter (Signed)
Wonderful, thank you

## 2020-02-01 NOTE — Telephone Encounter (Signed)
Noted I was able to start a new case and got it approved.  [employer] Josem Kaufmann: [id number] (exp. 02/01/20 to 07/29/20) for MRA Head & MRA Neck.  Auth: [id number] (exp. 01/17/20 to 07/14/20) for MRI brain w/wo contrast.  I sent the orders to [facility] they will reach out to the patient to schedule.

## 2020-02-15 ENCOUNTER — Other Ambulatory Visit: Payer: Self-pay

## 2020-02-15 ENCOUNTER — Encounter: Payer: Self-pay | Admitting: Primary Care

## 2020-02-15 ENCOUNTER — Ambulatory Visit: Payer: Self-pay | Admitting: Family Medicine

## 2020-02-15 ENCOUNTER — Ambulatory Visit: Payer: BC Managed Care – PPO | Admitting: Primary Care

## 2020-02-15 VITALS — BP 160/98 | HR 78 | Temp 98.6°F | Ht 63.54 in | Wt 298.8 lb

## 2020-02-15 DIAGNOSIS — Z1231 Encounter for screening mammogram for malignant neoplasm of breast: Secondary | ICD-10-CM | POA: Diagnosis not present

## 2020-02-15 DIAGNOSIS — Z8673 Personal history of transient ischemic attack (TIA), and cerebral infarction without residual deficits: Secondary | ICD-10-CM

## 2020-02-15 DIAGNOSIS — I160 Hypertensive urgency: Secondary | ICD-10-CM

## 2020-02-15 DIAGNOSIS — R87619 Unspecified abnormal cytological findings in specimens from cervix uteri: Secondary | ICD-10-CM

## 2020-02-15 DIAGNOSIS — H34232 Retinal artery branch occlusion, left eye: Secondary | ICD-10-CM | POA: Diagnosis not present

## 2020-02-15 DIAGNOSIS — R7303 Prediabetes: Secondary | ICD-10-CM

## 2020-02-15 DIAGNOSIS — I639 Cerebral infarction, unspecified: Secondary | ICD-10-CM | POA: Diagnosis not present

## 2020-02-15 DIAGNOSIS — Z853 Personal history of malignant neoplasm of breast: Secondary | ICD-10-CM

## 2020-02-15 DIAGNOSIS — I10 Essential (primary) hypertension: Secondary | ICD-10-CM | POA: Diagnosis not present

## 2020-02-15 MED ORDER — OLMESARTAN MEDOXOMIL 20 MG PO TABS: 20.0000 mg | ORAL_TABLET | Freq: Every day | ORAL | 0 refills | Status: DC

## 2020-02-15 NOTE — Assessment & Plan Note (Signed)
Prior history and recent history, evaluated in the emergency department in December 2021.  We are working to gain control over her blood pressure which has improved.  Stop lisinopril 20 mg. Continue hydrochlorothiazide 25 mg. Start olmesartan 20 mg. Cannot tolerate amlodipine.  We will see her back in a few weeks for blood pressure follow-up.

## 2020-02-15 NOTE — Assessment & Plan Note (Signed)
Noted in care everywhere well reviewing records.  She made no mention of this today. I do not see a recent Pap smear on file, we will discuss this during her upcoming visit.  If no recent Pap smear, then we will perform Pap smear, especially given history of breast cancer.

## 2020-02-15 NOTE — Assessment & Plan Note (Addendum)
Chronic history, uncontrolled over the years.  Blood pressure today improved, not yet at goal. We need to slowly reduce her blood pressure given her hypotensive emergency and recent stroke.  Stop lisinopril 20 mg, this is not best practice for the African-American ethnicity.  Continue hydrochlorothiazide 25 mg. From chart review, it looks like she was once on chlorthalidone 25 mg.  Unfortunately she cannot tolerate amlodipine as it makes her feel "sick".  This would have been a good option for her.  We will start olmesartan 20 mg daily, she was once on valsartan 320 mg.  She was once on metoprolol succinate 25 mg.  This was found in chart review.  We will plan to see her back in 2 to 3 weeks for blood pressure follow-up and labs.  I discussed for her to continue to monitor her blood pressure at home.

## 2020-02-15 NOTE — Assessment & Plan Note (Signed)
History of left breast cancer with left mastectomy at the age of 17.  No recent mammogram on file, mammogram orders placed today.

## 2020-02-15 NOTE — Progress Notes (Signed)
Subjective:    Patient ID: Leslie Graves, female    DOB: Mar 03, 1960, 60 y.o.   MRN: [id number]  HPI  This visit occurred during the SARS-CoV-2 public health emergency.  Safety protocols were in place, including screening questions prior to the visit, additional usage of staff PPE, and extensive cleaning of exam room while observing appropriate contact time as indicated for disinfecting solutions.   Leslie Graves is a 60 year old female who presents today to establish care and discuss the problems mentioned below. Will obtain/review records.  1) Hypertensive emergency/Essential Hypertension: Currently managed on HCTZ 25 mg, lisinopril 20 mg. She has been taking both medications since early December 2021 after her emergency department visit for hypertensive emergency.   She presented to [facility] emergency department on 12/25/2019 with a chief complaint of blurred vision.  She endorsed a prior history of hypertension but stopped taking medication as she thought one of them may have been "linked to a carcinogen".  During her visit it was recommended she undergo MRI to rule out stroke, in order to have this done she would have to transfer to another hospital for which she refused.  She was referred to neurology services and told to resume lisinopril 20 mg and hydrochlorothiazide 25 mg for which she had taken in the past.  Today she endorses a history of stroke, she does not recall much from that event or from whom she saw.  She was on hypertensive treatment "years ago" after her initial stroke, but stopped taking her medication as she felt that she was getting better.  She is checking her BP at home which is running in the 387'F-643'P systolic She cannot take amlodipine as it makes her "sick".   Wt Readings from Last 3 Encounters:  02/15/20 298 lb 12.8 oz (135.5 kg)  01/12/20 (!) 309 lb (140.2 kg)  12/25/19 300 lb (136.1 kg)     BP Readings from Last 3 Encounters:  02/15/20 (!) 160/98   01/12/20 (!) 183/92  12/25/19 (!) 198/137   2) CVA: Prior history from 2015 (found in [place]).  MRI from chart review shows positive DWI lesion at left middle cerebellar peduncle, and primary lesion in corpus callosum, subcortical white matter.  She was following with Dr. Leonie Man at the time.  It appears that there was a concern for potential multiple sclerosis, but MRI cervical spine did not show evidence.    She was evaluated by her ophthalmologist on 01/11/2020, diagnosed with left retinal artery occlusion.  Evaluated by neurology on 01/12/2020 who recommended MRI of brain, MRA of brain and neck, echocardiogram, stop aspirin 81 mg, "stop Plavix 75 mg daily"  Today she endorses that she was told to start Plavix 25 mg daily and to stop aspirin 81 mg daily.  She has stuck to this regimen.  She is compliant to her hydrochlorothiazide 25 mg and lisinopril 20 mg.  She is scheduled for her MRI and MRA for February 20, 2020.  She is also managed on simvastatin 20 mg for which was prescribed during her emergency department visit in late December.  She continues to experience vision loss, does follow with a retinal specialist.  Overall she is feeling better.  3) History of Breast Cancer: History of breast cancer at the age of 38, had left mastectomy.  No recent mammogram per patient.  Review of Systems  Constitutional: Negative for unexpected weight change.  Eyes: Positive for visual disturbance.  Respiratory: Negative for shortness of breath.   Cardiovascular:  Negative for chest pain.  Gastrointestinal: Negative for abdominal pain.  Genitourinary:       Postmenopausal  Skin: Negative for color change.  Allergic/Immunologic: Negative for environmental allergies.  Neurological: Negative for dizziness, weakness and headaches.  Psychiatric/Behavioral: The patient is not nervous/anxious.        Past Medical History:  Diagnosis Date  . Anemia   . Breast cancer ([place])    19 years ago-left  breast mastectomy-no chemo or radiation  . High cholesterol   . Hypertension   . RhD negative   . Stroke ([place]) 7/15   also had mini strokes  . Vision loss of left eye      Social History   Socioeconomic History  . Marital status: Married    Spouse name: Not on file  . Number of children: 4  . Years of education: MA  . Highest education level: Not on file  Occupational History    Employer: [employer]  Tobacco Use  . Smoking status: Former Research scientist (life sciences)  . Smokeless tobacco: Never Used  . Tobacco comment: 15-20 years ago  Substance and Sexual Activity  . Alcohol use: No  . Drug use: No  . Sexual activity: Yes    Partners: Male  Other Topics Concern  . Not on file  Social History Narrative   Lives at home with her husband.   Right-handed.   Caffeine use - 1-1.5 cups coffee per day.   Social Determinants of Health   Financial Resource Strain: Not on file  Food Insecurity: Not on file  Transportation Needs: Not on file  Physical Activity: Not on file  Stress: Not on file  Social Connections: Not on file  Intimate Partner Violence: Not on file    Past Surgical History:  Procedure Laterality Date  . LAPAROSCOPIC CHOLECYSTECTOMY  2008  . MASTECTOMY Left age 54   Dr Edison Pace, [place]  . TUBAL LIGATION      Family History  Problem Relation Age of Onset  . Cancer Mother        pancreatic  . Hypertension Mother   . Heart attack Father   . Hypertension Daughter   . Hypertension Son     Allergies  Allergen Reactions  . Other Nausea And Vomiting    CT dye    Current Outpatient Medications on File Prior to Visit  Medication Sig Dispense Refill  . acetaminophen (TYLENOL) 325 MG tablet Take 650 mg by mouth every 6 (six) hours as needed for mild pain, fever or headache.    . clopidogrel (PLAVIX) 75 MG tablet Take 1 tablet (75 mg total) by mouth daily. 30 tablet 11  . hydrochlorothiazide (HYDRODIURIL) 25 MG tablet Take 1 tablet (25 mg total) by mouth daily. 30 tablet 2   . Multiple Vitamins-Minerals (PRESERVISION AREDS PO) Take 1 capsule by mouth daily.    . simvastatin (ZOCOR) 20 MG tablet Take 1 tablet (20 mg total) by mouth daily at 6 PM. 30 tablet 2   No current facility-administered medications on file prior to visit.    BP (!) 160/98 (BP Location: Right Arm)   Pulse 78   Temp 98.6 F (37 C) (Temporal)   Ht 5' 3.54" (1.614 m)   Wt 298 lb 12.8 oz (135.5 kg)   LMP 01/22/2011 Comment: Patient is having vaginal bleeding off and on for the past year  SpO2 99%   BMI 52.03 kg/m    Objective:   Physical Exam Constitutional:      Appearance: She is  well-nourished.  HENT:     Head: Normocephalic.  Cardiovascular:     Rate and Rhythm: Normal rate and regular rhythm.  Pulmonary:     Effort: Pulmonary effort is normal.     Breath sounds: Normal breath sounds.  Musculoskeletal:        General: Normal range of motion.     Cervical back: Neck supple.  Skin:    General: Skin is warm and dry.  Neurological:     Mental Status: She is alert and oriented to person, place, and time.  Psychiatric:        Mood and Affect: Mood and affect and mood normal.            Assessment & Plan:  >45 minutes spent face to face with patient, >50% spent counseling or coordinating care.

## 2020-02-15 NOTE — Patient Instructions (Signed)
Stop taking lisinopril 20 mg for blood pressure.  Continue taking hydrochlorothiazide 25 mg for blood pressure.  Start taking olmesartan 20 mg once daily for blood pressure.  Start monitoring your blood pressure daily, around the same time of day, for the next 2-3 weeks.  Ensure that you have rested for 30 minutes prior to checking your blood pressure. Record your readings and bring them to your next visit.  [facility] to schedule your mammogram.   Please schedule a follow up visit to meet back with me in 2-3 weeks for blood pressure check. Come fasting 4 hours so we can recheck some labs.  It was a pleasure to meet you today! Please don't hesitate to call or message me with any questions. Welcome to [facility]!

## 2020-02-15 NOTE — Assessment & Plan Note (Signed)
Recent diagnosis likely secondary to uncontrolled hypertension.  Following with retinal specialist and neurology.  We are working to gain control of blood pressure. LDL goal of less than 70, repeat lipids at next visit.  Suspect we will need to change simvastatin to a high intensity statin such as rosuvastatin.  Continue clopidogrel 75 mg.

## 2020-02-15 NOTE — Assessment & Plan Note (Signed)
Occurring in 2015, unfortunately lost to follow-up over the years.  Recent left retinal artery occlusion. MRI/MRA brain pending.

## 2020-02-15 NOTE — Assessment & Plan Note (Signed)
Prior history of stroke in 2015. Recent stroke, left retinal artery occlusion as of January 12, 2020.  Now following with neurology, MRI and MRA pending. Continue Plavix 75 mg daily, I suspect the "stop Plavix" was a typo.  Remain off of aspirin.  We will work to gain better control of her blood pressure.  We need to recheck lipids at next visit, LDL goal of less than 70.  I suspect that simvastatin 20 mg is not appropriate.  Will likely need high intensity statin.

## 2020-02-15 NOTE — Assessment & Plan Note (Signed)
Noted on recent labs from early January 2022.  Discussed this diagnosis with her today, we will continue to monitor.

## 2020-02-16 ENCOUNTER — Institutional Professional Consult (permissible substitution): Payer: Self-pay | Admitting: Neurology

## 2020-02-20 ENCOUNTER — Ambulatory Visit
Admission: RE | Admit: 2020-02-20 | Discharge: 2020-02-20 | Disposition: A | Discharge status: Home / Self Care | Payer: BC Managed Care – PPO | Source: Ambulatory Visit | Attending: Neurology | Admitting: Neurology

## 2020-02-20 DIAGNOSIS — H34232 Retinal artery branch occlusion, left eye: Secondary | ICD-10-CM

## 2020-02-20 DIAGNOSIS — I639 Cerebral infarction, unspecified: Secondary | ICD-10-CM | POA: Diagnosis not present

## 2020-02-20 MED ORDER — GADOBENATE DIMEGLUMINE 529 MG/ML IV SOLN
20.0000 mL | Freq: Once | INTRAVENOUS | Status: AC | PRN
  Administered 2020-02-20: 20 mL via INTRAVENOUS

## 2020-02-21 ENCOUNTER — Telehealth: Payer: Self-pay | Admitting: Neurology

## 2020-02-21 NOTE — Telephone Encounter (Signed)
  IMPRESSION: 1. No acute infarct. Extensive chronic microvascular ischemic change in the white matter and brainstem, with progression since 2015. Numerous foci of chronic microhemorrhage also have progressed since 2015. This is likely due to poorly controlled hypertension. Cerebral amyloid is in the differential. 2. Negative MRA head 3. No significant carotid or vertebral artery stenosis in the neck. Mild stenosis proximal right vertebral.  Please call patient, MRI of the brain showed no acute infarction, but progression of microvascular ischemic change compared to previous scan in 2015, also numerous foci of chronic microhemorrhage,  MRA of the brain and neck showed no significant large vessel disease  This is most likely due to poorly controlled hypertension,  Please ask her to check and document her blood pressure daily

## 2020-02-21 NOTE — Telephone Encounter (Signed)
Left message requesting a return call.

## 2020-02-21 NOTE — Telephone Encounter (Signed)
I spoke to the patient and notified her of the MRI and MRA results. She has recently seen her new PCP who is monitoring her BP closely. She is currently working on a new medication regimen. She is keeping a log at home and checking it daily. It has started to improved with the systolic readings no more than the 140s. Diastolic readings have been within normal range.

## 2020-03-07 ENCOUNTER — Other Ambulatory Visit: Payer: Self-pay

## 2020-03-07 ENCOUNTER — Ambulatory Visit: Payer: BC Managed Care – PPO | Admitting: Primary Care

## 2020-03-07 ENCOUNTER — Encounter: Payer: Self-pay | Admitting: Primary Care

## 2020-03-07 VITALS — BP 168/74 | HR 70 | Temp 97.6°F | Ht 63.5 in | Wt 300.0 lb

## 2020-03-07 DIAGNOSIS — R87613 High grade squamous intraepithelial lesion on cytologic smear of cervix (HGSIL): Secondary | ICD-10-CM

## 2020-03-07 DIAGNOSIS — H34232 Retinal artery branch occlusion, left eye: Secondary | ICD-10-CM

## 2020-03-07 DIAGNOSIS — Z8673 Personal history of transient ischemic attack (TIA), and cerebral infarction without residual deficits: Secondary | ICD-10-CM

## 2020-03-07 DIAGNOSIS — I10 Essential (primary) hypertension: Secondary | ICD-10-CM | POA: Diagnosis not present

## 2020-03-07 MED ORDER — OLMESARTAN MEDOXOMIL 40 MG PO TABS: 40.0000 mg | ORAL_TABLET | Freq: Every day | ORAL | 0 refills | Status: DC

## 2020-03-07 NOTE — Assessment & Plan Note (Signed)
Recent MRA/MRI negative for infarct.  Will work to control BP.

## 2020-03-07 NOTE — Progress Notes (Signed)
Subjective:    Patient ID: Leslie Graves, female    DOB: 07/03/1960, 60 y.o.   MRN: [id number]  HPI  This visit occurred during the SARS-CoV-2 public health emergency.  Safety protocols were in place, including screening questions prior to the visit, additional usage of staff PPE, and extensive cleaning of exam room while observing appropriate contact time as indicated for disinfecting solutions.   Leslie Graves is a 60 year old female with a history of hypertensive emergency, retinal artery branch occlusion of left eye, CVA who presents today for follow up of hypertension.  1) Hypertension: She was last evaluated on 02/15/20 as a new patient and for evaluation of hypertensive emergency. During that visit we discussed her recent ED visit for her uncontrolled hypertension. BP during that visit was above goal, but improved from her ED visit. Home readings were in the 476-546 systolic. During this visit we continued her HCTZ 25 mg and changed lisinopril 20 mg to olmesartan 20 mg. She endorsed inability to tolerate amlodipine. She is here today for follow up.  Since her last visit she's checking her BP at home which is running 150's-170's/70's-80's. After her visit a chart review was conducted and she was once managed on valsartan-HCTZ 25 mg, chlorthalidone 25 mg, and metoprolol succinate 50 mg at varying times.  She underwent MRI/MRA which were negative for infarct, but showed chronic microvascular changes suspected to be secondary to uncontrolled hypertension. She denies chest pain, dizziness. Her vision to the left eye is improving.   BP Readings from Last 3 Encounters:  03/07/20 (!) 168/74  02/15/20 (!) 160/98  01/12/20 (!) 183/92   2) History of Abnormal Pap Smear: Noted during chart review after visit last month. Chronic over the years, last pap smear completed in 2015 which showed high grade squamous intraepithelial lesion. She's since not followed with GYN since 2015 due to lack of insurance  coverage. She does not have a GYN now.   Review of Systems  Eyes: Negative for visual disturbance.  Respiratory: Negative for shortness of breath.   Cardiovascular: Negative for chest pain.  Neurological: Negative for dizziness and headaches.       Past Medical History:  Diagnosis Date  . Anemia   . Breast cancer ([place])    19 years ago-left breast mastectomy-no chemo or radiation  . Breast lump 10/22/2013  . 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 Essential familial hyperlipidemia 08/26/2013  . H/O stroke within last year 09/24/2013  . High cholesterol   . Hypertension   . Numerous moles 08/26/2013  . RhD negative   . Stroke ([place]) 7/15   also had mini strokes  . Vision loss of left eye      Social History   Socioeconomic History  . Marital status: Married    Spouse name: Not on file  . Number of children: 4  . Years of education: MA  . Highest education level: Not on file  Occupational History    Employer: [employer]  Tobacco Use  . Smoking status: Former Research scientist (life sciences)  . Smokeless tobacco: Never Used  . Tobacco comment: 15-20 years ago  Substance and Sexual Activity  . Alcohol use: No  . Drug use: No  . Sexual activity: Yes    Partners: Male  Other Topics Concern  . Not on file  Social History Narrative   Lives at home with her husband.   Right-handed.   Caffeine use - 1-1.5 cups coffee per day.   Social Determinants of Health  Financial Resource Strain: Not on file  Food Insecurity: Not on file  Transportation Needs: Not on file  Physical Activity: Not on file  Stress: Not on file  Social Connections: Not on file  Intimate Partner Violence: Not on file    Past Surgical History:  Procedure Laterality Date  . LAPAROSCOPIC CHOLECYSTECTOMY  2008  . MASTECTOMY Left age 89   Dr Edison Pace, [place]  . TUBAL LIGATION      Family History  Problem Relation Age of Onset  . Cancer Mother        pancreatic  . Hypertension Mother   . Heart attack Father   . Hypertension  Daughter   . Hypertension Son     Allergies  Allergen Reactions  . Other Nausea And Vomiting    CT dye    Current Outpatient Medications on File Prior to Visit  Medication Sig Dispense Refill  . acetaminophen (TYLENOL) 325 MG tablet Take 650 mg by mouth every 6 (six) hours as needed for mild pain, fever or headache.    . clopidogrel (PLAVIX) 75 MG tablet Take 1 tablet (75 mg total) by mouth daily. 30 tablet 11  . hydrochlorothiazide (HYDRODIURIL) 25 MG tablet Take 1 tablet (25 mg total) by mouth daily. 30 tablet 2  . Multiple Vitamins-Minerals (PRESERVISION AREDS PO) Take 1 capsule by mouth daily.    . simvastatin (ZOCOR) 20 MG tablet Take 1 tablet (20 mg total) by mouth daily at 6 PM. 30 tablet 2   No current facility-administered medications on file prior to visit.    BP (!) 168/74   Pulse 70   Temp 97.6 F (36.4 C) (Temporal)   Ht 5' 3.5" (1.613 m)   Wt 300 lb (136.1 kg)   LMP 01/22/2011 Comment: Patient is having vaginal bleeding off and on for the past year  SpO2 100%   BMI 52.31 kg/m    Objective:   Physical Exam Constitutional:      Appearance: She is well-nourished.  Cardiovascular:     Rate and Rhythm: Normal rate and regular rhythm.  Pulmonary:     Effort: Pulmonary effort is normal.     Breath sounds: Normal breath sounds.  Musculoskeletal:     Cervical back: Neck supple.  Skin:    General: Skin is warm and dry.  Psychiatric:        Mood and Affect: Mood and affect normal.            Assessment & Plan:

## 2020-03-07 NOTE — Assessment & Plan Note (Signed)
Noted on pap smear from 2015, no GYN follow up since. I offered to complete a pap smear today, she kindly declines.  She does agree to GYN referral which was placed. History of breast cancer, mammogram pending.

## 2020-03-07 NOTE — Patient Instructions (Signed)
Stop by the lab prior to leaving today. I will notify you of your results once received.   You will be contacted regarding your referral to GYN.  Please let us know if you have not been contacted within two weeks.   We increased the dose of your olmesartan to 40 mg for blood pressure. Continue taking hydrochlorothiazide 25 mg for blood pressure.  Please schedule a follow up visit to meet back with me in 2-3 weeks for blood pressure check.   It was a pleasure to see you today!

## 2020-03-07 NOTE — Assessment & Plan Note (Signed)
Compliant to simvastatin 20 mg, repeat lipid panel pending today. LDL goal of <70 given CVA history.  Recent MRI/MRA negative for acute stroke.

## 2020-03-07 NOTE — Assessment & Plan Note (Signed)
About the same as last visit. Increase olmesartan to 40 mg, continue HCTZ 25 mg.  BMP pending today. We will see her back in 2-3 weeks for BP check.

## 2020-03-08 LAB — BASIC METABOLIC PANEL
BUN: 21 mg/dL (ref 6–23)
CO2: 28 mEq/L (ref 19–32)
Calcium: 10.5 mg/dL (ref 8.4–10.5)
Chloride: 101 mEq/L (ref 96–112)
Creatinine, Ser: 1.03 mg/dL (ref 0.40–1.20)
GFR: 58.76 mL/min — ABNORMAL LOW (ref >60.00)
Glucose, Bld: 84 mg/dL (ref 70–99)
Potassium: 4 mEq/L (ref 3.5–5.1)
Sodium: 139 mEq/L (ref 135–145)

## 2020-03-08 LAB — LIPID PANEL
Cholesterol: 164 mg/dL (ref 0–200)
HDL: 57 mg/dL (ref >39.00)
LDL Cholesterol: 89 mg/dL (ref 0–99)
NonHDL: 106.8
Total CHOL/HDL Ratio: 3
Triglycerides: 89 mg/dL (ref 0.0–149.0)
VLDL: 17.8 mg/dL (ref 0.0–40.0)

## 2020-03-09 ENCOUNTER — Other Ambulatory Visit: Payer: Self-pay | Admitting: Primary Care

## 2020-03-09 ENCOUNTER — Other Ambulatory Visit: Payer: Self-pay

## 2020-03-09 DIAGNOSIS — I10 Essential (primary) hypertension: Secondary | ICD-10-CM

## 2020-03-09 MED ORDER — ROSUVASTATIN CALCIUM 10 MG PO TABS: 10.0000 mg | ORAL_TABLET | Freq: Every day | ORAL | 3 refills | Status: DC

## 2020-03-28 ENCOUNTER — Encounter: Payer: Self-pay | Admitting: Primary Care

## 2020-03-28 ENCOUNTER — Other Ambulatory Visit: Payer: Self-pay

## 2020-03-28 ENCOUNTER — Ambulatory Visit: Payer: BC Managed Care – PPO | Admitting: Primary Care

## 2020-03-28 VITALS — BP 140/88 | HR 63 | Temp 97.0°F | Ht 63.5 in | Wt 307.8 lb

## 2020-03-28 DIAGNOSIS — Z8673 Personal history of transient ischemic attack (TIA), and cerebral infarction without residual deficits: Secondary | ICD-10-CM

## 2020-03-28 DIAGNOSIS — I10 Essential (primary) hypertension: Secondary | ICD-10-CM

## 2020-03-28 LAB — COMPREHENSIVE METABOLIC PANEL
ALT: 23 U/L (ref 0–35)
AST: 15 U/L (ref 0–37)
Albumin: 3.9 g/dL (ref 3.5–5.2)
Alkaline Phosphatase: 80 U/L (ref 39–117)
BUN: 21 mg/dL (ref 6–23)
CO2: 28 mEq/L (ref 19–32)
Calcium: 10.1 mg/dL (ref 8.4–10.5)
Chloride: 105 mEq/L (ref 96–112)
Creatinine, Ser: 1.07 mg/dL (ref 0.40–1.20)
GFR: 56.11 mL/min — ABNORMAL LOW (ref >60.00)
Glucose, Bld: 90 mg/dL (ref 70–99)
Potassium: 4.1 mEq/L (ref 3.5–5.1)
Sodium: 139 mEq/L (ref 135–145)
Total Bilirubin: 0.5 mg/dL (ref 0.2–1.2)
Total Protein: 7 g/dL (ref 6.0–8.3)

## 2020-03-28 LAB — LIPID PANEL
Cholesterol: 170 mg/dL (ref 0–200)
HDL: 59.4 mg/dL (ref >39.00)
LDL Cholesterol: 93 mg/dL (ref 0–99)
NonHDL: 110.92
Total CHOL/HDL Ratio: 3
Triglycerides: 88 mg/dL (ref 0.0–149.0)
VLDL: 17.6 mg/dL (ref 0.0–40.0)

## 2020-03-28 MED ORDER — HYDROCHLOROTHIAZIDE 25 MG PO TABS: 25.0000 mg | ORAL_TABLET | Freq: Every day | ORAL | 3 refills | Status: DC

## 2020-03-28 MED ORDER — AMLODIPINE BESYLATE 5 MG PO TABS: 5.0000 mg | ORAL_TABLET | Freq: Every day | ORAL | 3 refills | Status: DC

## 2020-03-28 MED ORDER — OLMESARTAN MEDOXOMIL 40 MG PO TABS: 40.0000 mg | ORAL_TABLET | Freq: Every day | ORAL | 3 refills | Status: DC

## 2020-03-28 NOTE — Assessment & Plan Note (Signed)
Compliant to rosuvastatin 10 mg daily, repeat lipid and LFT's pending. Goal LDL of <70.

## 2020-03-28 NOTE — Patient Instructions (Addendum)
Stop by the lab prior to leaving today. I will notify you of your results once received.   Continue olmesartan 40 mg and hydrochlorothiazide 25 mg daily for blood pressure.   Start amlodipine 5 mg once daily for blood pressure.   Continue to monitor your blood pressure and send me some readings in three weeks. Your blood pressure should be below 135/90.  Please schedule a follow up appointment in 6 months.   It was a pleasure to see you today!

## 2020-03-28 NOTE — Progress Notes (Signed)
Subjective:    Patient ID: Leslie Graves, female    DOB: 1960-08-31, 60 y.o.   MRN: [id number]  HPI  This visit occurred during the SARS-CoV-2 public health emergency.  Safety protocols were in place, including screening questions prior to the visit, additional usage of staff PPE, and extensive cleaning of exam room while observing appropriate contact time as indicated for disinfecting solutions.   Leslie Graves is a 60 year old female with a history of hypertensive urgency, hypertension, CVA, breast cancer who presents today for follow up of hypertension.  She was last evaluated on 03/07/20 for follow up from initial visit of hypertensive urgency. No improvement in BP readings so we increased olmesartan to 40 mg and continued HCTZ. She is here today for follow up and repeat BMP.  She is checking BP at home which is running 140/80's. She ran out of her HCTZ three days ago. She denies chest pain, dizziness. She continues to experience visual disturbance, needs to schedule a follow up visit and will do so.  BP Readings from Last 3 Encounters:  03/28/20 140/88  03/07/20 (!) 168/74  02/15/20 (!) 160/98     Review of Systems  Eyes: Positive for visual disturbance.  Respiratory: Negative for shortness of breath.   Cardiovascular: Negative for chest pain.  Neurological: Negative for dizziness and headaches.       Past Medical History:  Diagnosis Date  . Anemia   . Breast cancer ([place])    19 years ago-left breast mastectomy-no chemo or radiation  . Breast lump 10/22/2013  . 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 Essential familial hyperlipidemia 08/26/2013  . H/O stroke within last year 09/24/2013  . High cholesterol   . Hypertension   . Numerous moles 08/26/2013  . RhD negative   . Stroke ([place]) 7/15   also had mini strokes  . Vision loss of left eye      Social History   Socioeconomic History  . Marital status: Married    Spouse name: Not on file  . Number of children: 4  . Years of  education: MA  . Highest education level: Not on file  Occupational History    Employer: [employer]  Tobacco Use  . Smoking status: Former Research scientist (life sciences)  . Smokeless tobacco: Never Used  . Tobacco comment: 15-20 years ago  Substance and Sexual Activity  . Alcohol use: No  . Drug use: No  . Sexual activity: Yes    Partners: Male  Other Topics Concern  . Not on file  Social History Narrative   Lives at home with her husband.   Right-handed.   Caffeine use - 1-1.5 cups coffee per day.   Social Determinants of Health   Financial Resource Strain: Not on file  Food Insecurity: Not on file  Transportation Needs: Not on file  Physical Activity: Not on file  Stress: Not on file  Social Connections: Not on file  Intimate Partner Violence: Not on file    Past Surgical History:  Procedure Laterality Date  . LAPAROSCOPIC CHOLECYSTECTOMY  2008  . MASTECTOMY Left age 26   Dr Edison Pace, [place]  . TUBAL LIGATION      Family History  Problem Relation Age of Onset  . Cancer Mother        pancreatic  . Hypertension Mother   . Heart attack Father   . Hypertension Daughter   . Hypertension Son     Allergies  Allergen Reactions  . Other Nausea And Vomiting  CT dye    Current Outpatient Medications on File Prior to Visit  Medication Sig Dispense Refill  . acetaminophen (TYLENOL) 325 MG tablet Take 650 mg by mouth every 6 (six) hours as needed for mild pain, fever or headache.    . clopidogrel (PLAVIX) 75 MG tablet Take 1 tablet (75 mg total) by mouth daily. 30 tablet 11  . Multiple Vitamins-Minerals (PRESERVISION AREDS PO) Take 1 capsule by mouth daily.    . rosuvastatin (CRESTOR) 10 MG tablet Take 1 tablet (10 mg total) by mouth daily. 90 tablet 3   No current facility-administered medications on file prior to visit.    BP 140/88   Pulse 63   Temp (!) 97 F (36.1 C) (Temporal)   Ht 5' 3.5" (1.613 m)   Wt (!) 307 lb 12 oz (139.6 kg)   LMP 01/22/2011 Comment: Patient is having  vaginal bleeding off and on for the past year  SpO2 100%   BMI 53.66 kg/m    Objective:   Physical Exam Constitutional:      Appearance: She is well-nourished.  Cardiovascular:     Rate and Rhythm: Normal rate and regular rhythm.  Pulmonary:     Effort: Pulmonary effort is normal.     Breath sounds: Normal breath sounds.  Musculoskeletal:     Cervical back: Neck supple.  Skin:    General: Skin is warm and dry.  Psychiatric:        Mood and Affect: Mood and affect normal.            Assessment & Plan:

## 2020-03-28 NOTE — Assessment & Plan Note (Signed)
Improved and headed in the right direction, not quite at goal.  She does agree to trial a low dose amlodipine 5 mg as she can't quite recall if it actually made her sick. Rx sent to pharmacy.  Continue olmesartan 40 mg, HCTZ 25 mg, start amlodipine 5 mg.  She will continue to monitor her BP at home, she will send readings via My Chart in 2-3 weeks. Repeat CMP pending today.

## 2020-03-30 ENCOUNTER — Other Ambulatory Visit: Payer: Self-pay

## 2020-03-30 MED ORDER — ROSUVASTATIN CALCIUM 20 MG PO TABS: 10.0000 mg | ORAL_TABLET | Freq: Every day | ORAL | 3 refills | Status: DC

## 2020-04-10 NOTE — Progress Notes (Deleted)
No chief complaint on file.   HISTORICAL  Leslie Graves is a 60 year old female, accompanied by her daughter Leslie Graves, seen in request by her ophthalmologist Dr. Princess Bruins and her primary care nurse practitioner Vicenta Aly for evaluation of sudden onset of left visual loss on December 22, 2019  I reviewed and summarized the referring note.  Past medical history Hypertension Hyperlipidemia Stroke Breast cancer, status post left mastectomy, Obesity  She was seen by Dr. Leonie Man in the past, presented with sudden onset nausea vomiting on August 07, 2013, with elevated blood pressure, systolic above 993, transient disconjugate eye movement,  MRI of the brain showed weak diffuse positive DWI lesion at left middle cerebellar peduncle, and primary lesion in corpus callosum, subcortical white matter,  Initially there was a concern of multiple sclerosis, MRI cervical spine showed no definite MS plaques  Echocardiogram normal ejection fraction, ultrasound of carotid artery showed no large vessel disease, LDL was elevated to 136, she was treated with aspirin 81 mg daily, and statin Zocor 20 mg daily, blood pressure medication  Her symptoms quickly resolved, think her abnormal MRI findings is most due to hypertensive urgency, small vessel disease,  On Dec 22 2019, when she first got of bed, her vision was normal, around 10 AM, while she was about leaving her house, she suddenly had blurry vision, reached its peak in couple minutes, when she calls her right eye, she realized that it was her left eye causing trouble, she can only see light, but no detail,  She denies headache, no lateralized motor or sensory deficit  She was initially evaluated by optometrist, later referred to ophthalmologist Dr.Govind on January 11, 2020, was diagnosed with left retinal artery occlusion, over the past 3 weeks, she has essentially no recovery of her vision, she can see black-and-white shadow at her left  peripheral vision,  Update April 11, 2020 SS:   Feb 20 2020 MRI of the brain showed no acute infarction, but showed progression of microvascular ischemic change compared to previous in 2015, also numerous foci of chronic microhemorrhage  Feb 20 2020 MRI of the brain and neck showed no significant large vessel disease  Echocardiogram was ordered, but is yet to be completed   REVIEW OF SYSTEMS: Full 14 system review of systems performed and notable only for above All other review of systems were negative.  ALLERGIES: Allergies  Allergen Reactions  . Other Nausea And Vomiting    CT dye    HOME MEDICATIONS: Current Outpatient Medications  Medication Sig Dispense Refill  . acetaminophen (TYLENOL) 325 MG tablet Take 650 mg by mouth every 6 (six) hours as needed for mild pain, fever or headache.    Marland Kitchen amLODipine (NORVASC) 5 MG tablet Take 1 tablet (5 mg total) by mouth daily. For blood pressure. 90 tablet 3  . clopidogrel (PLAVIX) 75 MG tablet Take 1 tablet (75 mg total) by mouth daily. 30 tablet 11  . hydrochlorothiazide (HYDRODIURIL) 25 MG tablet Take 1 tablet (25 mg total) by mouth daily. For blood pressure. 90 tablet 3  . Multiple Vitamins-Minerals (PRESERVISION AREDS PO) Take 1 capsule by mouth daily.    Marland Kitchen olmesartan (BENICAR) 40 MG tablet Take 1 tablet (40 mg total) by mouth daily. For blood pressure. 90 tablet 3  . rosuvastatin (CRESTOR) 20 MG tablet Take 0.5 tablets (10 mg total) by mouth daily. For cholesterol. 90 tablet 3   No current facility-administered medications for this visit.    PAST MEDICAL HISTORY: Past Medical History:  Diagnosis Date  . Anemia   . Breast cancer ([place])    19 years ago-left breast mastectomy-no chemo or radiation  . Breast lump 10/22/2013  . 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 Essential familial hyperlipidemia 08/26/2013  . H/O stroke within last year 09/24/2013  . High cholesterol   . Hypertension   . Numerous moles 08/26/2013  . RhD negative   .  Stroke ([place]) 7/15   also had mini strokes  . Vision loss of left eye     PAST SURGICAL HISTORY: Past Surgical History:  Procedure Laterality Date  . LAPAROSCOPIC CHOLECYSTECTOMY  2008  . MASTECTOMY Left age 2   Dr Edison Pace, [place]  . TUBAL LIGATION      FAMILY HISTORY: Family History  Problem Relation Age of Onset  . Cancer Mother        pancreatic  . Hypertension Mother   . Heart attack Father   . Hypertension Daughter   . Hypertension Son     SOCIAL HISTORY: Social History   Socioeconomic History  . Marital status: Married    Spouse name: Not on file  . Number of children: 4  . Years of education: MA  . Highest education level: Not on file  Occupational History    Employer: [employer]  Tobacco Use  . Smoking status: Former Research scientist (life sciences)  . Smokeless tobacco: Never Used  . Tobacco comment: 15-20 years ago  Substance and Sexual Activity  . Alcohol use: No  . Drug use: No  . Sexual activity: Yes    Partners: Male  Other Topics Concern  . Not on file  Social History Narrative   Lives at home with her husband.   Right-handed.   Caffeine use - 1-1.5 cups coffee per day.   Social Determinants of Health   Financial Resource Strain: Not on file  Food Insecurity: Not on file  Transportation Needs: Not on file  Physical Activity: Not on file  Stress: Not on file  Social Connections: Not on file  Intimate Partner Violence: Not on file     PHYSICAL EXAM   There were no vitals filed for this visit. Not recorded     There is no height or weight on file to calculate BMI.  PHYSICAL EXAMNIATION:  Gen: NAD, conversant, well nourised, well groomed                     Cardiovascular: Regular rate rhythm, no peripheral edema, warm, nontender. Eyes: Conjunctivae clear without exudates or hemorrhage Neck: Supple, no carotid bruits. Pulmonary: Clear to auscultation bilaterally   NEUROLOGICAL EXAM:  MENTAL STATUS: Speech:    Speech is normal; fluent and  spontaneous with normal comprehension.  Cognition:     Orientation to time, place and person     Normal recent and remote memory     Normal Attention span and concentration     Normal Language, naming, repeating,spontaneous speech     Fund of knowledge   CRANIAL NERVES: CN II: Visual fields are full to confrontation.  Left afferent pupillary defect CN III, IV, VI: extraocular movement are normal. No ptosis. CN V: Facial sensation is intact to light touch CN VII: Face is symmetric with normal eye closure  CN VIII: Hearing is normal to causal conversation. CN IX, X: Phonation is normal. CN XI: Head turning and shoulder shrug are intact  MOTOR: There is no pronator drift of out-stretched arms. Muscle bulk and tone are normal. Muscle strength is normal.  REFLEXES: Reflexes  are 2+ and symmetric at the biceps, triceps, knees, and ankles. Plantar responses are flexor.  SENSORY: Intact to light touch, pinprick and vibratory sensation are intact in fingers and toes.  COORDINATION: There is no trunk or limb dysmetria noted.  GAIT/STANCE: Posture is normal. Gait is steady with normal steps, base, arm swing, and turning.  Limited by her big body habitus  DIAGNOSTIC DATA (LABS, IMAGING, TESTING) - I reviewed patient records, labs, notes, testing and imaging myself where available.  ASSESSMENT AND PLAN  Freddye Cardamone is a 60 y.o. female   1. Left retinal artery occlusion on January 11, 2020 2. History of small vessel stroke in 2015  -Vascular risk factor of obesity, hypertension, lost to follow-up with her  primary care physician for more than 4 years  -Continue Plavix 75 mg daily, switched from aspirin at last visit  -Laboratory evaluation for vascular risk factor stratification  -MRI of the brain showed no acute infarction, but showed progression of microvascular ischemic change compared to previous in 2015, also numerous foci of chronic microhemorrhage (Jan 2022)  -MRI of the  brain and neck showed no significant large vessel disease (Jan 2022)  -Extensive laboratory evaluation (RPR, B12, folate, HIV, TSH, CK, CRP, sed rate, A1c, ANA), showed only mildly elevated A1c 5.7  -Echocardiogram was ordered, but has yet to be completed    I spent *** minutes of face-to-face and non-face-to-face time with patient.  This included previsit chart review, lab review, study review, order entry, electronic health record documentation, patient education.  Evangeline Dakin, DNP  [facility] [address] [place], [place] [address] [phone]

## 2020-04-11 ENCOUNTER — Ambulatory Visit: Payer: BC Managed Care – PPO | Admitting: Neurology

## 2020-04-11 NOTE — Progress Notes (Deleted)
No chief complaint on file.   HISTORICAL  Leslie Graves is a 60 year old female, accompanied by her daughter Leslie Graves, seen in request by her ophthalmologist Dr. Princess Bruins and her primary care nurse practitioner Vicenta Aly for evaluation of sudden onset of left visual loss on December 22, 2019  I reviewed and summarized the referring note.  Past medical history Hypertension Hyperlipidemia Stroke Breast cancer, status post left mastectomy, Obesity  She was seen by Dr. Leonie Man in the past, presented with sudden onset nausea vomiting on August 07, 2013, with elevated blood pressure, systolic above 811, transient disconjugate eye movement,  MRI of the brain showed weak diffuse positive DWI lesion at left middle cerebellar peduncle, and primary lesion in corpus callosum, subcortical white matter,  Initially there was a concern of multiple sclerosis, MRI cervical spine showed no definite MS plaques  Echocardiogram normal ejection fraction, ultrasound of carotid artery showed no large vessel disease, LDL was elevated to 136, she was treated with aspirin 81 mg daily, and statin Zocor 20 mg daily, blood pressure medication  Her symptoms quickly resolved, think her abnormal MRI findings is most due to hypertensive urgency, small vessel disease,  On Dec 22 2019, when she first got of bed, her vision was normal, around 10 AM, while she was about leaving her house, she suddenly had blurry vision, reached its peak in couple minutes, when she calls her right eye, she realized that it was her left eye causing trouble, she can only see light, but no detail,  She denies headache, no lateralized motor or sensory deficit  She was initially evaluated by optometrist, later referred to ophthalmologist Dr.Govind on January 11, 2020, was diagnosed with left retinal artery occlusion, over the past 3 weeks, she has essentially no recovery of her vision, she can see black-and-white shadow at her left  peripheral vision,  Update April 11, 2020 SS:   Feb 20 2020 MRI of the brain showed no acute infarction, but showed progression of microvascular ischemic change compared to previous in 2015, also numerous foci of chronic microhemorrhage  Feb 20 2020 MRI of the brain and neck showed no significant large vessel disease  Echocardiogram was ordered, but is yet to be completed   REVIEW OF SYSTEMS: Full 14 system review of systems performed and notable only for above All other review of systems were negative.  ALLERGIES: Allergies  Allergen Reactions  . Other Nausea And Vomiting    CT dye    HOME MEDICATIONS: Current Outpatient Medications  Medication Sig Dispense Refill  . acetaminophen (TYLENOL) 325 MG tablet Take 650 mg by mouth every 6 (six) hours as needed for mild pain, fever or headache.    Marland Kitchen amLODipine (NORVASC) 5 MG tablet Take 1 tablet (5 mg total) by mouth daily. For blood pressure. 90 tablet 3  . clopidogrel (PLAVIX) 75 MG tablet Take 1 tablet (75 mg total) by mouth daily. 30 tablet 11  . hydrochlorothiazide (HYDRODIURIL) 25 MG tablet Take 1 tablet (25 mg total) by mouth daily. For blood pressure. 90 tablet 3  . Multiple Vitamins-Minerals (PRESERVISION AREDS PO) Take 1 capsule by mouth daily.    Marland Kitchen olmesartan (BENICAR) 40 MG tablet Take 1 tablet (40 mg total) by mouth daily. For blood pressure. 90 tablet 3  . rosuvastatin (CRESTOR) 20 MG tablet Take 0.5 tablets (10 mg total) by mouth daily. For cholesterol. 90 tablet 3   No current facility-administered medications for this visit.    PAST MEDICAL HISTORY: Past Medical History:  Diagnosis Date  . Anemia   . Breast cancer ([place])    19 years ago-left breast mastectomy-no chemo or radiation  . Breast lump 10/22/2013  . 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 Essential familial hyperlipidemia 08/26/2013  . H/O stroke within last year 09/24/2013  . High cholesterol   . Hypertension   . Numerous moles 08/26/2013  . RhD negative   .  Stroke ([place]) 7/15   also had mini strokes  . Vision loss of left eye     PAST SURGICAL HISTORY: Past Surgical History:  Procedure Laterality Date  . LAPAROSCOPIC CHOLECYSTECTOMY  2008  . MASTECTOMY Left age 57   Dr Edison Pace, [place]  . TUBAL LIGATION      FAMILY HISTORY: Family History  Problem Relation Age of Onset  . Cancer Mother        pancreatic  . Hypertension Mother   . Heart attack Father   . Hypertension Daughter   . Hypertension Son     SOCIAL HISTORY: Social History   Socioeconomic History  . Marital status: Married    Spouse name: Not on file  . Number of children: 4  . Years of education: MA  . Highest education level: Not on file  Occupational History    Employer: [employer]  Tobacco Use  . Smoking status: Former Research scientist (life sciences)  . Smokeless tobacco: Never Used  . Tobacco comment: 15-20 years ago  Substance and Sexual Activity  . Alcohol use: No  . Drug use: No  . Sexual activity: Yes    Partners: Male  Other Topics Concern  . Not on file  Social History Narrative   Lives at home with her husband.   Right-handed.   Caffeine use - 1-1.5 cups coffee per day.   Social Determinants of Health   Financial Resource Strain: Not on file  Food Insecurity: Not on file  Transportation Needs: Not on file  Physical Activity: Not on file  Stress: Not on file  Social Connections: Not on file  Intimate Partner Violence: Not on file     PHYSICAL EXAM   There were no vitals filed for this visit. Not recorded     There is no height or weight on file to calculate BMI.  PHYSICAL EXAMNIATION:  Gen: NAD, conversant, well nourised, well groomed                     Cardiovascular: Regular rate rhythm, no peripheral edema, warm, nontender. Eyes: Conjunctivae clear without exudates or hemorrhage Neck: Supple, no carotid bruits. Pulmonary: Clear to auscultation bilaterally   NEUROLOGICAL EXAM:  MENTAL STATUS: Speech:    Speech is normal; fluent and  spontaneous with normal comprehension.  Cognition:     Orientation to time, place and person     Normal recent and remote memory     Normal Attention span and concentration     Normal Language, naming, repeating,spontaneous speech     Fund of knowledge   CRANIAL NERVES: CN II: Visual fields are full to confrontation.  Left afferent pupillary defect CN III, IV, VI: extraocular movement are normal. No ptosis. CN V: Facial sensation is intact to light touch CN VII: Face is symmetric with normal eye closure  CN VIII: Hearing is normal to causal conversation. CN IX, X: Phonation is normal. CN XI: Head turning and shoulder shrug are intact  MOTOR: There is no pronator drift of out-stretched arms. Muscle bulk and tone are normal. Muscle strength is normal.  REFLEXES: Reflexes  are 2+ and symmetric at the biceps, triceps, knees, and ankles. Plantar responses are flexor.  SENSORY: Intact to light touch, pinprick and vibratory sensation are intact in fingers and toes.  COORDINATION: There is no trunk or limb dysmetria noted.  GAIT/STANCE: Posture is normal. Gait is steady with normal steps, base, arm swing, and turning.  Limited by her big body habitus  DIAGNOSTIC DATA (LABS, IMAGING, TESTING) - I reviewed patient records, labs, notes, testing and imaging myself where available.  ASSESSMENT AND PLAN  Natahsa Marian is a 60 y.o. female   1. Left retinal artery occlusion on January 11, 2020 2. History of small vessel stroke in 2015  -Vascular risk factor of obesity, hypertension, lost to follow-up with her  primary care physician for more than 4 years  -Continue Plavix 75 mg daily, switched from aspirin at last visit  -Laboratory evaluation for vascular risk factor stratification  -MRI of the brain showed no acute infarction, but showed progression of microvascular ischemic change compared to previous in 2015, also numerous foci of chronic microhemorrhage (Jan 2022)  -MRI of the  brain and neck showed no significant large vessel disease (Jan 2022)  -Extensive laboratory evaluation (RPR, B12, folate, HIV, TSH, CK, CRP, sed rate, A1c, ANA), showed only mildly elevated A1c 5.7  -Echocardiogram was ordered, but has yet to be completed -Continue follow-up with PCP for management of vascular risk factors, just started amlodipine     I spent *** minutes of face-to-face and non-face-to-face time with patient.  This included previsit chart review, lab review, study review, order entry, electronic health record documentation, patient education.  Evangeline Dakin, DNP  [facility] [address], [place], [place] [address] [phone]

## 2020-04-12 ENCOUNTER — Ambulatory Visit: Payer: BC Managed Care – PPO | Admitting: Neurology

## 2020-04-25 ENCOUNTER — Encounter: Payer: BC Managed Care – PPO | Admitting: Obstetrics & Gynecology

## 2020-05-23 ENCOUNTER — Telehealth: Payer: Self-pay

## 2020-05-23 NOTE — Telephone Encounter (Signed)
[facility] just wanted to let you know pt missed appt and they have not been able to reach her

## 2020-05-23 NOTE — Telephone Encounter (Signed)
Routing to Pleas Koch, NP MA to follow-up with patient.   She has a hx of abnormal pap smear and has not had repeat or follow-up. This puts her at risk for cancer. If she does not want to see the OB/GYN I would recommend she schedule a follow-up visit with Anda Kraft next week for pap smear. If normal then can proceed with follow-up with Anda Kraft.

## 2020-05-25 NOTE — Telephone Encounter (Signed)
Left message to return call to our office.  

## 2020-05-29 NOTE — Telephone Encounter (Signed)
Called patient states she will call office back to set up appointment. Informed if for any reason she did not want to go to let our office know and we will need to f/u with Anda Kraft.

## 2020-06-08 ENCOUNTER — Other Ambulatory Visit: Payer: Self-pay | Admitting: Primary Care

## 2020-06-08 DIAGNOSIS — I10 Essential (primary) hypertension: Secondary | ICD-10-CM

## 2020-07-12 NOTE — Progress Notes (Signed)
Chief Complaint  Patient presents with   Follow-up    New rm, alone, states she is doing well, no new concerns    HISTORICAL  Leslie Graves is a 60 year old female, accompanied by her daughter Leslie Graves, seen in request by her ophthalmologist Dr. Princess Bruins and her primary care nurse practitioner Vicenta Aly for evaluation of sudden onset of left visual loss on December 22, 2019  I reviewed and summarized the referring note.  Past medical history Hypertension Hyperlipidemia Stroke Breast cancer, status post left mastectomy, Obesity  She was seen by Dr. Leonie Man in the past, presented with sudden onset nausea vomiting on August 07, 2013, with elevated blood pressure, systolic above 001, transient disconjugate eye movement,  MRI of the brain showed weak diffuse positive DWI lesion at left middle cerebellar peduncle, and primary lesion in corpus callosum, subcortical white matter,  Initially there was a concern of multiple sclerosis, MRI cervical spine showed no definite MS plaques  Echocardiogram normal ejection fraction, ultrasound of carotid artery showed no large vessel disease, LDL was elevated to 136, she was treated with aspirin 81 mg daily, and statin Zocor 20 mg daily, blood pressure medication  Her symptoms quickly resolved, think her abnormal MRI findings is most due to hypertensive urgency, small vessel disease,  On Dec 22 2019, when she first got of bed, her vision was normal, around 10 AM, while she was about leaving her house, she suddenly had blurry vision, reached its peak in couple minutes, when she calls her right eye, she realized that it was her left eye causing trouble, she can only see light, but no detail,  She denies headache, no lateralized motor or sensory deficit  She was initially evaluated by optometrist, later referred to ophthalmologist Dr.Govind on January 11, 2020, was diagnosed with left retinal artery occlusion, over the past 3 weeks, she has  essentially no recovery of her vision, she can see black-and-white shadow at her left peripheral vision,  Update July 13, 2020 SS: Here today alone, left vision remains minimal, can see some from periphery, can see light/dark/colors. Stopped seeing eye doctor, didn't feel it was helpful. BP doing better, goal is < 130/90, usually runs in 130's. On Olmesartan, HCTZ, Norvasc, on Crestor, Plavix. Is watching her diet, has close follow-up with PCP.    MRI of the brain in January 2022 showed no acute infarction, but progression of microvascular ischemic change compared to prior scan in 2015, also numerous foci of chronic microhemorrhage.  MRA of the head and neck showed no significant large vessel disease.  Most likely result of poorly controlled HTN.  ECHO has not been completed.   Lipid panel March 2022, LDL 93, cholesterol 170, HDL 59   REVIEW OF SYSTEMS: Full 14 system review of systems performed and notable only for above  See HPI  ALLERGIES: Allergies  Allergen Reactions   Other Nausea And Vomiting    CT dye    HOME MEDICATIONS: Current Outpatient Medications  Medication Sig Dispense Refill   acetaminophen (TYLENOL) 325 MG tablet Take 650 mg by mouth every 6 (six) hours as needed for mild pain, fever or headache.     amLODipine (NORVASC) 5 MG tablet Take 1 tablet (5 mg total) by mouth daily. For blood pressure. 90 tablet 3   clopidogrel (PLAVIX) 75 MG tablet Take 1 tablet (75 mg total) by mouth daily. 30 tablet 11   hydrochlorothiazide (HYDRODIURIL) 25 MG tablet Take 1 tablet (25 mg total) by mouth daily. For blood  pressure. 90 tablet 3   Multiple Vitamins-Minerals (PRESERVISION AREDS PO) Take 1 capsule by mouth daily.     olmesartan (BENICAR) 40 MG tablet Take 1 tablet (40 mg total) by mouth daily. For blood pressure. 90 tablet 3   rosuvastatin (CRESTOR) 20 MG tablet Take 0.5 tablets (10 mg total) by mouth daily. For cholesterol. 90 tablet 3   No current facility-administered  medications for this visit.    PAST MEDICAL HISTORY: Past Medical History:  Diagnosis Date   Anemia    Breast cancer ([place])    19 years ago-left breast mastectomy-no chemo or radiation   Breast lump 10/22/2013   Dysconjugate gaze 08/07/2013   Essential familial hyperlipidemia 08/26/2013   H/O stroke within last year 09/24/2013   High cholesterol    Hypertension    Numerous moles 08/26/2013   RhD negative    Stroke ([place]) 7/15   also had mini strokes   Vision loss of left eye     PAST SURGICAL HISTORY: Past Surgical History:  Procedure Laterality Date   LAPAROSCOPIC CHOLECYSTECTOMY  2008   MASTECTOMY Left age 21   Dr Edison Pace, [facility]   TUBAL LIGATION      FAMILY HISTORY: Family History  Problem Relation Age of Onset   Cancer Mother        pancreatic   Hypertension Mother    Heart attack Father    Hypertension Daughter    Hypertension Son     SOCIAL HISTORY: Social History   Socioeconomic History   Marital status: Married    Spouse name: Not on file   Number of children: 4   Years of education: MA   Highest education level: Not on file  Occupational History    Employer: [employer]  Tobacco Use   Smoking status: Former    Pack years: 0.00   Smokeless tobacco: Never   Tobacco comments:    15-20 years ago  Substance and Sexual Activity   Alcohol use: No   Drug use: No   Sexual activity: Yes    Partners: Male  Other Topics Concern   Not on file  Social History Narrative   Lives at home with her husband.   Right-handed.   Caffeine use - 1-1.5 cups coffee per day.   Social Determinants of Health   Financial Resource Strain: Not on file  Food Insecurity: Not on file  Transportation Needs: Not on file  Physical Activity: Not on file  Stress: Not on file  Social Connections: Not on file  Intimate Partner Violence: Not on file   PHYSICAL EXAM   Vitals:   07/13/20 0924  BP: (!) 147/74  Pulse: 79  Weight: (!) 309 lb (140.2 kg)  Height: 5\' 3"  (1.6 m)     Not recorded    Body mass index is 54.74 kg/m.  Physical Exam  General: The patient is alert and cooperative at the time of the examination.  Skin: No significant peripheral edema is noted. Obese.  Neurologic Exam  Mental status: The patient is alert and oriented x 3 at the time of the examination. The patient has apparent normal recent and remote memory, with an apparently normal attention span and concentration ability.  Cranial nerves: Facial symmetry is present. Speech is normal, no aphasia or dysarthria is noted. Extraocular movements are full. Dr. Krista Blue has noted left afferent pupillary defect. Visual fields are full.  Motor: The patient has good strength in all 4 extremities.  Sensory examination: Soft touch sensation is symmetric  on the face, arms, and legs.  Coordination: The patient has good finger-nose-finger and heel-to-shin bilaterally.  Gait and station: The patient has a normal gait with evidence of large body habitus   Reflexes: Deep tendon reflexes are symmetric.  DIAGNOSTIC DATA (LABS, IMAGING, TESTING) - I reviewed patient records, labs, notes, testing and imaging myself where available.   ASSESSMENT AND PLAN  Leslie Graves is a 60 y.o. female   Left retinal artery occlusion on January 11, 2020 2.   History of small vessel stroke in 2015  -Continue to work with PCP for management of vascular risk factors, including obesity, HTN  -Goal BP < 130/90, LDL < 70, A1c , 7.0 -Continue Plavix 75 mg daily (was previously on aspirin) -MRI of the brain in January 2022 showed no acute infarction, but progression of microvascular ischemic change compared to prior scan in 2015, also numerous foci of chronic microhemorrhage -MRA of the head and neck showed no significant large vessel disease -Imaging findings most likely result of poorly controlled HTN -Echocardiogram is yet to be completed, will get this set up -Extensive laboratory evaluation Jan 2022 (RPR, B12,  folate, HIV, TSH, CK, CRP, sed rate, A1c, ANA) showed elevated A1C 5.7 -Encouraged to established with ophthalmologist for routine care -We will follow-up in 6 months or sooner if needed, if echocardiogram is unremarkable, since close follow-up with primary care, may see back as needed   I spent 30 minutes of face-to-face and non-face-to-face time with patient.  This included previsit chart review, lab review, study review, discussing risk factors, medications, management, and follow-up.   Evangeline Dakin, DNP  [facility] [address], [place], [place] [address] [phone]    ,

## 2020-07-13 ENCOUNTER — Ambulatory Visit: Payer: BC Managed Care – PPO | Admitting: Neurology

## 2020-07-13 ENCOUNTER — Telehealth: Payer: Self-pay | Admitting: Neurology

## 2020-07-13 ENCOUNTER — Encounter: Payer: Self-pay | Admitting: Neurology

## 2020-07-13 ENCOUNTER — Other Ambulatory Visit: Payer: Self-pay

## 2020-07-13 VITALS — BP 147/74 | HR 79 | Ht 63.0 in | Wt 309.0 lb

## 2020-07-13 DIAGNOSIS — I639 Cerebral infarction, unspecified: Secondary | ICD-10-CM | POA: Diagnosis not present

## 2020-07-13 DIAGNOSIS — H34232 Retinal artery branch occlusion, left eye: Secondary | ICD-10-CM

## 2020-07-13 NOTE — Patient Instructions (Signed)
Need to get echocardiogram  Continue current medications Goal for BP < 130/90, LDL < 70 Will schedule follow-up in 6 months  Please get back in with eye doctor

## 2020-07-13 NOTE — Telephone Encounter (Signed)
Can you help Ms. Brannick get scheduled from echocardiogram ordered in Dec please. Thanks!

## 2020-07-19 NOTE — Telephone Encounter (Signed)
I called patient's plan ([place]) at [phone] to check CPT codes for echo: [place], [id number], [phone], and [id number]. No Josem Kaufmann is required. Spoke w/ Butch Penny at [facility] vascular. She will call patient to schedule.

## 2020-07-26 ENCOUNTER — Ambulatory Visit (HOSPITAL_COMMUNITY): Payer: BC Managed Care – PPO

## 2020-08-13 DIAGNOSIS — Z8673 Personal history of transient ischemic attack (TIA), and cerebral infarction without residual deficits: Secondary | ICD-10-CM

## 2020-08-13 DIAGNOSIS — I10 Essential (primary) hypertension: Secondary | ICD-10-CM

## 2020-08-17 ENCOUNTER — Other Ambulatory Visit: Payer: Self-pay

## 2020-08-17 ENCOUNTER — Ambulatory Visit (HOSPITAL_COMMUNITY)
Admission: RE | Admit: 2020-08-17 | Discharge: 2020-08-17 | Disposition: A | Discharge status: Home / Self Care | Payer: BC Managed Care – PPO | Source: Ambulatory Visit | Attending: Neurology | Admitting: Neurology

## 2020-08-17 DIAGNOSIS — I639 Cerebral infarction, unspecified: Secondary | ICD-10-CM

## 2020-08-17 DIAGNOSIS — H34232 Retinal artery branch occlusion, left eye: Secondary | ICD-10-CM | POA: Diagnosis not present

## 2020-08-17 DIAGNOSIS — I10 Essential (primary) hypertension: Secondary | ICD-10-CM | POA: Diagnosis not present

## 2020-08-17 DIAGNOSIS — E785 Hyperlipidemia, unspecified: Secondary | ICD-10-CM | POA: Insufficient documentation

## 2020-08-17 LAB — ECHOCARDIOGRAM COMPLETE
Area-P 1/2: 2.09 cm2
S' Lateral: 2.6 cm

## 2020-08-17 NOTE — Progress Notes (Signed)
  Echocardiogram 2D Echocardiogram has been performed.  Leslie Graves 08/17/2020, 1:39 PM

## 2020-08-21 ENCOUNTER — Telehealth: Payer: Self-pay | Admitting: Neurology

## 2020-08-21 NOTE — Telephone Encounter (Signed)
I called the patient.  2D echocardiogram does not show any cardiac source of stroke.  The patient will remain on Plavix.   2D echo 08/17/20:  IMPRESSIONS      1. Left ventricular ejection fraction, by estimation, is 65 to 70%. The left ventricle has normal function. The left ventricle has no regional wall motion abnormalities. Left ventricular diastolic parameters are consistent with Grade I diastolic dysfunction (impaired relaxation).  2. Right ventricular systolic function is normal. The right ventricular size is normal. There is normal pulmonary artery systolic pressure.  3. The mitral valve is normal in structure. Trivial mitral valve regurgitation. No evidence of mitral stenosis.  4. The aortic valve is tricuspid. Aortic valve regurgitation is not visualized. No aortic stenosis is present.  5. The inferior vena cava is normal in size with greater than 50% respiratory variability, suggesting right atrial pressure of 3 mmHg.

## 2020-08-25 MED ORDER — ROSUVASTATIN CALCIUM 20 MG PO TABS
20.0000 mg | ORAL_TABLET | Freq: Every day | ORAL | 1 refills | Status: DC
Start: 2020-08-25 — End: 2021-03-08

## 2020-08-25 MED ORDER — OLMESARTAN MEDOXOMIL 40 MG PO TABS: 40.0000 mg | ORAL_TABLET | Freq: Every day | ORAL | 1 refills | Status: DC

## 2020-08-25 MED ORDER — HYDROCHLOROTHIAZIDE 25 MG PO TABS
25.0000 mg | ORAL_TABLET | Freq: Every day | ORAL | 1 refills | Status: DC
Start: 2020-08-25 — End: 2021-03-08

## 2020-08-25 MED ORDER — AMLODIPINE BESYLATE 5 MG PO TABS: 5.0000 mg | ORAL_TABLET | Freq: Every day | ORAL | 1 refills | Status: DC

## 2020-08-25 NOTE — Telephone Encounter (Signed)
Leslie Graves, can you delete the other pharmacies? She prefers the [facility] on [address]

## 2020-08-25 NOTE — Telephone Encounter (Signed)
FYI

## 2020-08-28 NOTE — Telephone Encounter (Signed)
All other pharmacy has been removed. No further action at this time

## 2020-09-11 ENCOUNTER — Other Ambulatory Visit: Payer: Self-pay | Admitting: *Deleted

## 2020-09-11 ENCOUNTER — Encounter: Payer: Self-pay | Admitting: Neurology

## 2020-09-11 MED ORDER — CLOPIDOGREL BISULFATE 75 MG PO TABS: 75.0000 mg | ORAL_TABLET | Freq: Every day | ORAL | 5 refills | Status: DC

## 2020-09-29 ENCOUNTER — Ambulatory Visit: Payer: BC Managed Care – PPO | Admitting: Primary Care

## 2020-10-04 NOTE — Progress Notes (Signed)
Chart reviewed, agree above plan ?

## 2020-12-08 ENCOUNTER — Other Ambulatory Visit: Payer: Self-pay | Admitting: Neurology

## 2021-01-10 NOTE — Progress Notes (Deleted)
No chief complaint on file.  HISTORICAL  Leslie Graves is a 60 year old female, accompanied by her daughter Leslie Graves, seen in request by her ophthalmologist Dr. Princess Bruins and her primary care nurse practitioner Vicenta Aly for evaluation of sudden onset of left visual loss on December 22, 2019  I reviewed and summarized the referring note.  Past medical history Hypertension Hyperlipidemia Stroke Breast cancer, status post left mastectomy, Obesity  She was seen by Dr. Leonie Man in the past, presented with sudden onset nausea vomiting on August 07, 2013, with elevated blood pressure, systolic above 542, transient disconjugate eye movement,  MRI of the brain showed weak diffuse positive DWI lesion at left middle cerebellar peduncle, and primary lesion in corpus callosum, subcortical white matter,  Initially there was a concern of multiple sclerosis, MRI cervical spine showed no definite MS plaques  Echocardiogram normal ejection fraction, ultrasound of carotid artery showed no large vessel disease, LDL was elevated to 136, she was treated with aspirin 81 mg daily, and statin Zocor 20 mg daily, blood pressure medication  Her symptoms quickly resolved, think her abnormal MRI findings is most due to hypertensive urgency, small vessel disease,  On Dec 22 2019, when she first got of bed, her vision was normal, around 10 AM, while she was about leaving her house, she suddenly had blurry vision, reached its peak in couple minutes, when she calls her right eye, she realized that it was her left eye causing trouble, she can only see light, but no detail,  She denies headache, no lateralized motor or sensory deficit  She was initially evaluated by optometrist, later referred to ophthalmologist Dr.Govind on January 11, 2020, was diagnosed with left retinal artery occlusion, over the past 3 weeks, she has essentially no recovery of her vision, she can see black-and-white shadow at her left  peripheral vision,  Update July 13, 2020 SS: Here today alone, left vision remains minimal, can see some from periphery, can see light/dark/colors. Stopped seeing eye doctor, didn't feel it was helpful. BP doing better, goal is < 130/90, usually runs in 130's. On Olmesartan, HCTZ, Norvasc, on Crestor, Plavix. Is watching her diet, has close follow-up with PCP.    MRI of the brain in January 2022 showed no acute infarction, but progression of microvascular ischemic change compared to prior scan in 2015, also numerous foci of chronic microhemorrhage.  MRA of the head and neck showed no significant large vessel disease.  Most likely result of poorly controlled HTN.  ECHO has not been completed.   Lipid panel March 2022, LDL 93, cholesterol 170, HDL 59  Update January 11, 2021 SS: 2D echocardiogram showed no cardiac source of stroke, remains on Plavix.   REVIEW OF SYSTEMS: Full 14 system review of systems performed and notable only for above  See HPI  ALLERGIES: Allergies  Allergen Reactions   Other Nausea And Vomiting    CT dye    HOME MEDICATIONS: Current Outpatient Medications  Medication Sig Dispense Refill   acetaminophen (TYLENOL) 325 MG tablet Take 650 mg by mouth every 6 (six) hours as needed for mild pain, fever or headache.     amLODipine (NORVASC) 5 MG tablet Take 1 tablet (5 mg total) by mouth daily. For blood pressure. 90 tablet 1   clopidogrel (PLAVIX) 75 MG tablet TAKE 1 TABLET BY MOUTH EVERY DAY 30 tablet 1   hydrochlorothiazide (HYDRODIURIL) 25 MG tablet Take 1 tablet (25 mg total) by mouth daily. For blood pressure. 90 tablet 1  Multiple Vitamins-Minerals (PRESERVISION AREDS PO) Take 1 capsule by mouth daily.     olmesartan (BENICAR) 40 MG tablet Take 1 tablet (40 mg total) by mouth daily. For blood pressure. 90 tablet 1   rosuvastatin (CRESTOR) 20 MG tablet Take 1 tablet (20 mg total) by mouth daily. For cholesterol. 90 tablet 1   No current facility-administered  medications for this visit.    PAST MEDICAL HISTORY: Past Medical History:  Diagnosis Date   Anemia    Breast cancer ([place])    19 years ago-left breast mastectomy-no chemo or radiation   Breast lump 10/22/2013   Dysconjugate gaze 08/07/2013   Essential familial hyperlipidemia 08/26/2013   H/O stroke within last year 09/24/2013   High cholesterol    Hypertension    Numerous moles 08/26/2013   RhD negative    Stroke ([place]) 7/15   also had mini strokes   Vision loss of left eye     PAST SURGICAL HISTORY: Past Surgical History:  Procedure Laterality Date   LAPAROSCOPIC CHOLECYSTECTOMY  2008   MASTECTOMY Left age 32   Dr Edison Pace, [facility]   TUBAL LIGATION      FAMILY HISTORY: Family History  Problem Relation Age of Onset   Cancer Mother        pancreatic   Hypertension Mother    Heart attack Father    Hypertension Daughter    Hypertension Son     SOCIAL HISTORY: Social History   Socioeconomic History   Marital status: Married    Spouse name: Not on file   Number of children: 4   Years of education: MA   Highest education level: Not on file  Occupational History    Employer: [employer]  Tobacco Use   Smoking status: Former   Smokeless tobacco: Never   Tobacco comments:    15-20 years ago  Substance and Sexual Activity   Alcohol use: No   Drug use: No   Sexual activity: Yes    Partners: Male  Other Topics Concern   Not on file  Social History Narrative   Lives at home with her husband.   Right-handed.   Caffeine use - 1-1.5 cups coffee per day.   Social Determinants of Health   Financial Resource Strain: Not on file  Food Insecurity: Not on file  Transportation Needs: Not on file  Physical Activity: Not on file  Stress: Not on file  Social Connections: Not on file  Intimate Partner Violence: Not on file   PHYSICAL EXAM   There were no vitals filed for this visit.   Not recorded    There is no height or weight on file to calculate BMI.  Physical  Exam  General: The patient is alert and cooperative at the time of the examination.  Skin: No significant peripheral edema is noted. Obese.  Neurologic Exam  Mental status: The patient is alert and oriented x 3 at the time of the examination. The patient has apparent normal recent and remote memory, with an apparently normal attention span and concentration ability.  Cranial nerves: Facial symmetry is present. Speech is normal, no aphasia or dysarthria is noted. Extraocular movements are full. Dr. Krista Blue has noted left afferent pupillary defect. Visual fields are full.  Motor: The patient has good strength in all 4 extremities.  Sensory examination: Soft touch sensation is symmetric on the face, arms, and legs.  Coordination: The patient has good finger-nose-finger and heel-to-shin bilaterally.  Gait and station: The patient has a normal  gait with evidence of large body habitus   Reflexes: Deep tendon reflexes are symmetric.  DIAGNOSTIC DATA (LABS, IMAGING, TESTING) - I reviewed patient records, labs, notes, testing and imaging myself where available.   ASSESSMENT AND PLAN  Leslie Graves is a 60 y.o. female   Left retinal artery occlusion on January 11, 2020 2.   History of small vessel stroke in 2015  -Continue to work with PCP for management of vascular risk factors, including obesity, HTN  -Goal BP < 130/90, LDL < 70, A1c , 7.0 -Continue Plavix 75 mg daily (was previously on aspirin) -MRI of the brain in January 2022 showed no acute infarction, but progression of microvascular ischemic change compared to prior scan in 2015, also numerous foci of chronic microhemorrhage -MRA of the head and neck showed no significant large vessel disease -Imaging findings most likely result of poorly controlled HTN -Echocardiogram showed no cardiac source of the stroke -Extensive laboratory evaluation Jan 2022 (RPR, B12, folate, HIV, TSH, CK, CRP, sed rate, A1c, ANA) showed elevated A1C  5.7 -Encouraged to established with ophthalmologist for routine care -Return here as needed, keep follow-up with PCP for management of vascular risk factors   Evangeline Dakin, DNP  [facility] [address] [place], [place] [address] [phone]    ,

## 2021-01-11 ENCOUNTER — Ambulatory Visit: Payer: BC Managed Care – PPO | Admitting: Neurology

## 2021-01-11 ENCOUNTER — Encounter: Payer: Self-pay | Admitting: Neurology

## 2021-02-06 ENCOUNTER — Other Ambulatory Visit: Payer: Self-pay | Admitting: Neurology

## 2021-02-15 DIAGNOSIS — H3412 Central retinal artery occlusion, left eye: Secondary | ICD-10-CM | POA: Diagnosis not present

## 2021-02-15 DIAGNOSIS — H25813 Combined forms of age-related cataract, bilateral: Secondary | ICD-10-CM | POA: Diagnosis not present

## 2021-02-15 DIAGNOSIS — H353131 Nonexudative age-related macular degeneration, bilateral, early dry stage: Secondary | ICD-10-CM | POA: Diagnosis not present

## 2021-02-21 DIAGNOSIS — Z0289 Encounter for other administrative examinations: Secondary | ICD-10-CM

## 2021-02-28 ENCOUNTER — Telehealth: Payer: Self-pay | Admitting: Primary Care

## 2021-02-28 ENCOUNTER — Other Ambulatory Visit: Payer: Self-pay | Admitting: Neurology

## 2021-02-28 DIAGNOSIS — I10 Essential (primary) hypertension: Secondary | ICD-10-CM

## 2021-03-01 NOTE — Telephone Encounter (Signed)
Patient due for follow up/CPE, please schedule.

## 2021-03-02 NOTE — Telephone Encounter (Signed)
Pt scheduled cpe/lab in march

## 2021-03-03 IMAGING — MR MR MRA NECK WO/W CM
3 of 6 series · 23 of 48 positions shown · IV contrast (20 MULTIHANCE)
Comparison: MRI head 08/08/2013

CLINICAL DATA: Stroke follow-up. High cholesterol, hypertension.
History of hypertensive urgency.

EXAM:
MR HEAD WITHOUT CONTRAST
MR CIRCLE OF WILLIS WITHOUT CONTRAST
MRA OF THE NECK WITHOUT AND WITH CONTRAST
TECHNIQUE: Multiplanar, multiecho pulse sequences of the brain, circle of
willis and surrounding structures were obtained without intravenous
contrast. Angiographic images of the neck were obtained using MRA
technique without and with intravenous contrast.
CONTRAST:  20mL MULTIHANCE GADOBENATE DIMEGLUMINE 529 MG/ML IV SOLN

[Series 6: tof_2d neck non · axial · 3.0mm · 0.65mm/px · z∈[-164,-6]mm · 7 of 80 slices shown]
[im 1/80]
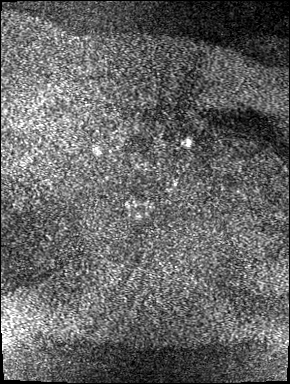
[im 14/80]
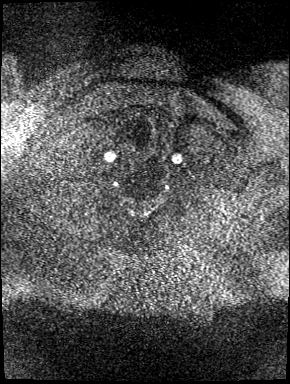
[im 27/80]
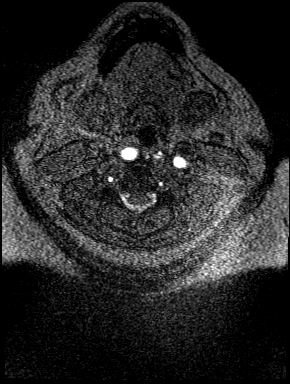
[im 40/80]
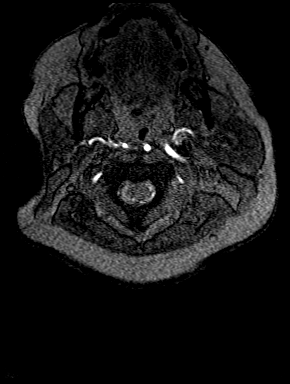
[im 53/80]
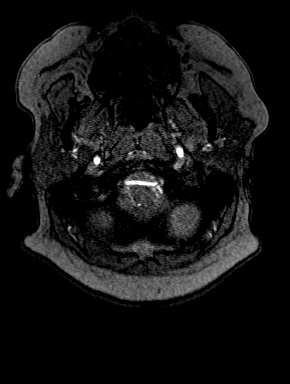
[im 66/80]
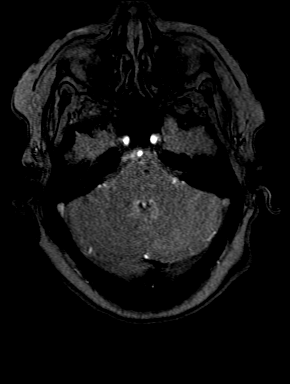
[im 80/80]
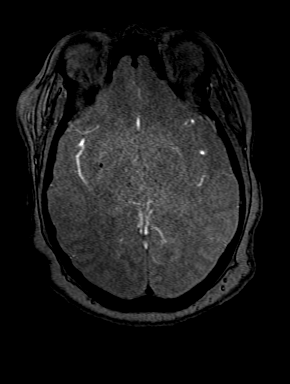

[Series 21: angio_fl3d_cor_post · coronal · 0.8mm · 0.85mm/px · 8 of 96 slices shown]
[im 1/96]
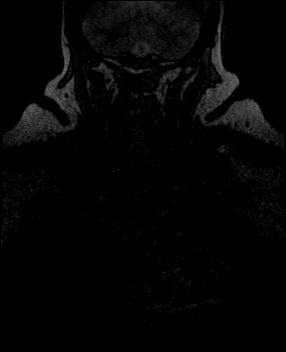
[im 14/96]
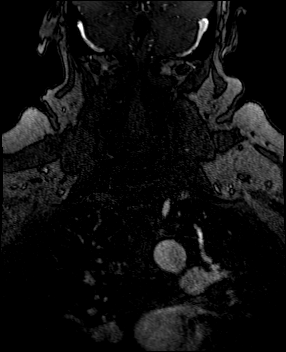
[im 28/96]
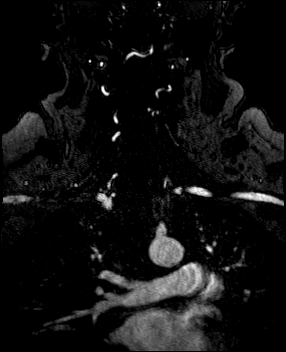
[im 41/96]
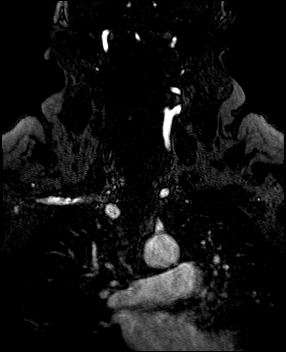
[im 55/96]
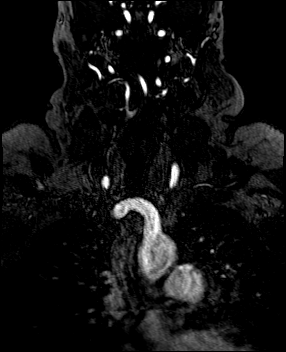
[im 68/96]
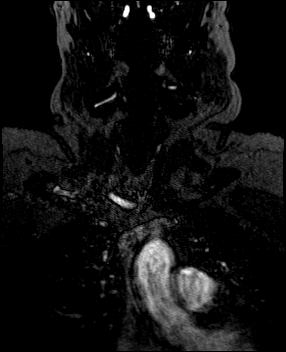
[im 82/96]
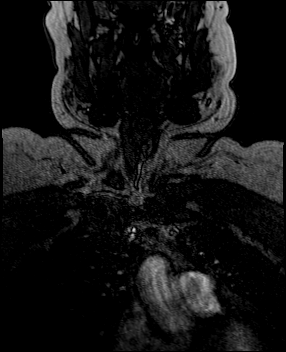
[im 96/96]
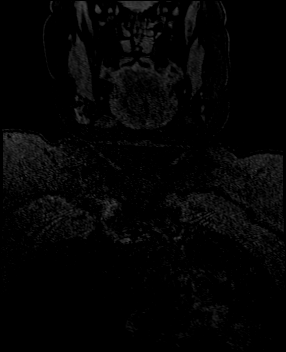

[Series 22: angio_fl3d_cor_post_sub · coronal · 0.8mm · 0.85mm/px · 8 of 95 slices shown]
[im 1/95]
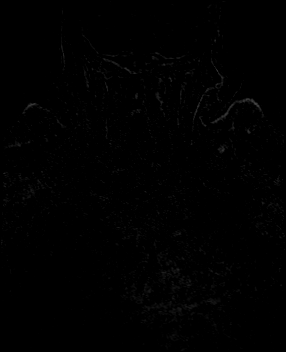
[im 14/95]
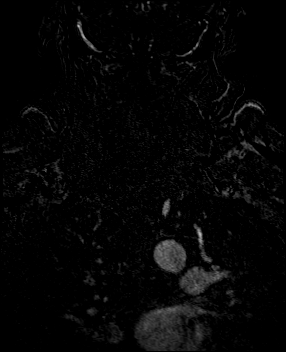
[im 27/95]
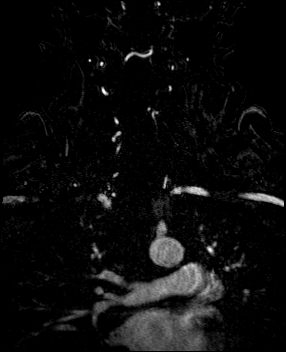
[im 41/95]
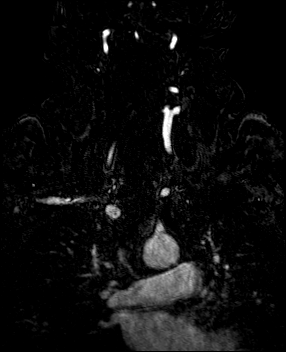
[im 54/95]
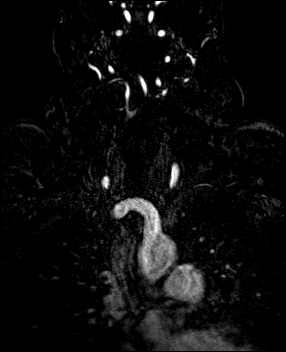
[im 68/95]
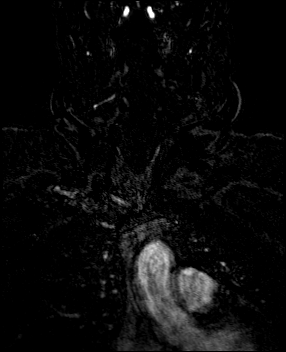
[im 81/95]
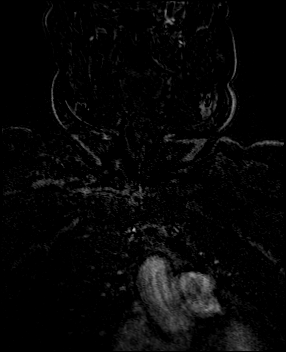
[im 95/95]
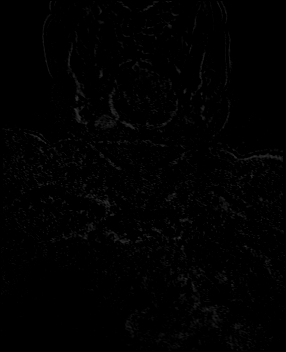

[23 of 48 positions shown; findings below may reference images not displayed]

FINDINGS: MR HEAD FINDINGS

Brain: Progressive white matter changes. Multiple periventricular
deep white matter hyperintensities have progressed. Multiple small
lacunar infarctions in the pons also have progressed.

Numerous foci of chronic microhemorrhage are seen throughout both
cerebral hemispheres. Chronic microhemorrhage in the thalamus and
pons bilaterally. Chronic hemorrhage in the cerebellum bilaterally.
This has progressed in the interval. Negative for mass or edema.
Ventricle size normal.

Vascular: Normal arterial flow voids.

Skull and upper cervical spine: No focal skeletal abnormality.

Sinuses/Orbits: Paranasal sinuses clear.  Negative orbit

Other: None

MR CIRCLE OF WILLIS FINDINGS

Internal carotid artery widely patent bilaterally. Anterior middle
cerebral arteries widely patent without stenosis or occlusion

Both vertebral arteries patent to the basilar. Left PICA patent.
Small right PICA. Basilar widely patent. Posterior cerebral arteries
patent bilaterally. Patent posterior communicating artery
bilaterally with mild hypoplastic P1 segment bilaterally, felt to be
congenital.

MRA NECK FINDINGS

Normal aortic arch and proximal great vessels.

Antegrade flow in carotid and vertebral arteries bilaterally

Carotid bifurcation widely patent without stenosis

Right vertebral dominant. Mild stenosis proximal right vertebral
artery. Remainder of the right vertebral artery is patent to the
basilar. Left vertebral artery patent to the basilar without
stenosis.
IMPRESSION: 1. No acute infarct. Extensive chronic microvascular ischemic change
in the white matter and brainstem, with progression since 2439.
Numerous foci of chronic microhemorrhage also have progressed since
2439. This is likely due to poorly controlled hypertension. Cerebral
amyloid is in the differential.
2. Negative MRA head
3. No significant carotid or vertebral artery stenosis in the neck.
Mild stenosis proximal right vertebral.

## 2021-03-08 ENCOUNTER — Other Ambulatory Visit: Payer: Self-pay | Admitting: Primary Care

## 2021-03-08 DIAGNOSIS — Z8673 Personal history of transient ischemic attack (TIA), and cerebral infarction without residual deficits: Secondary | ICD-10-CM

## 2021-03-08 DIAGNOSIS — I10 Essential (primary) hypertension: Secondary | ICD-10-CM

## 2021-03-29 ENCOUNTER — Other Ambulatory Visit: Payer: Self-pay | Admitting: Primary Care

## 2021-03-29 DIAGNOSIS — I10 Essential (primary) hypertension: Secondary | ICD-10-CM

## 2021-04-07 ENCOUNTER — Other Ambulatory Visit: Payer: Self-pay | Admitting: Primary Care

## 2021-04-07 ENCOUNTER — Other Ambulatory Visit: Payer: Self-pay | Admitting: Neurology

## 2021-04-07 DIAGNOSIS — I10 Essential (primary) hypertension: Secondary | ICD-10-CM

## 2021-04-07 DIAGNOSIS — Z8673 Personal history of transient ischemic attack (TIA), and cerebral infarction without residual deficits: Secondary | ICD-10-CM

## 2021-04-20 ENCOUNTER — Other Ambulatory Visit (INDEPENDENT_AMBULATORY_CARE_PROVIDER_SITE_OTHER): Payer: BC Managed Care – PPO

## 2021-04-20 ENCOUNTER — Encounter: Payer: Self-pay | Admitting: Primary Care

## 2021-04-20 ENCOUNTER — Ambulatory Visit (INDEPENDENT_AMBULATORY_CARE_PROVIDER_SITE_OTHER): Payer: BC Managed Care – PPO | Admitting: Primary Care

## 2021-04-20 VITALS — BP 136/78 | HR 77 | Ht 64.0 in | Wt 322.0 lb

## 2021-04-20 DIAGNOSIS — Z853 Personal history of malignant neoplasm of breast: Secondary | ICD-10-CM

## 2021-04-20 DIAGNOSIS — D649 Anemia, unspecified: Secondary | ICD-10-CM

## 2021-04-20 DIAGNOSIS — Z0000 Encounter for general adult medical examination without abnormal findings: Secondary | ICD-10-CM | POA: Diagnosis not present

## 2021-04-20 DIAGNOSIS — Z1211 Encounter for screening for malignant neoplasm of colon: Secondary | ICD-10-CM

## 2021-04-20 DIAGNOSIS — I10 Essential (primary) hypertension: Secondary | ICD-10-CM | POA: Diagnosis not present

## 2021-04-20 DIAGNOSIS — Z8673 Personal history of transient ischemic attack (TIA), and cerebral infarction without residual deficits: Secondary | ICD-10-CM

## 2021-04-20 DIAGNOSIS — Z6841 Body Mass Index (BMI) 40.0 and over, adult: Secondary | ICD-10-CM

## 2021-04-20 DIAGNOSIS — Z1231 Encounter for screening mammogram for malignant neoplasm of breast: Secondary | ICD-10-CM | POA: Diagnosis not present

## 2021-04-20 DIAGNOSIS — H34232 Retinal artery branch occlusion, left eye: Secondary | ICD-10-CM

## 2021-04-20 DIAGNOSIS — R7303 Prediabetes: Secondary | ICD-10-CM

## 2021-04-20 LAB — COMPREHENSIVE METABOLIC PANEL
ALT: 21 U/L (ref 0–35)
AST: 15 U/L (ref 0–37)
Albumin: 4.4 g/dL (ref 3.5–5.2)
Alkaline Phosphatase: 72 U/L (ref 39–117)
BUN: 19 mg/dL (ref 6–23)
CO2: 28 mEq/L (ref 19–32)
Calcium: 10.6 mg/dL — ABNORMAL HIGH (ref 8.4–10.5)
Chloride: 104 mEq/L (ref 96–112)
Creatinine, Ser: 1 mg/dL (ref 0.40–1.20)
GFR: 60.4 mL/min (ref >60.00)
Glucose, Bld: 94 mg/dL (ref 70–99)
Potassium: 3.5 mEq/L (ref 3.5–5.1)
Sodium: 140 mEq/L (ref 135–145)
Total Bilirubin: 0.6 mg/dL (ref 0.2–1.2)
Total Protein: 7.4 g/dL (ref 6.0–8.3)

## 2021-04-20 LAB — CBC
HCT: 41.3 % (ref 36.0–46.0)
Hemoglobin: 13.5 g/dL (ref 12.0–15.0)
MCHC: 32.7 g/dL (ref 30.0–36.0)
MCV: 79.1 fl (ref 78.0–100.0)
Platelets: 182 10*3/uL (ref 150.0–400.0)
RBC: 5.23 Mil/uL — ABNORMAL HIGH (ref 3.87–5.11)
RDW: 14.2 % (ref 11.5–15.5)
WBC: 3.7 10*3/uL — ABNORMAL LOW (ref 4.0–10.5)

## 2021-04-20 LAB — LIPID PANEL
Cholesterol: 162 mg/dL (ref 0–200)
HDL: 65.4 mg/dL (ref >39.00)
LDL Cholesterol: 82 mg/dL (ref 0–99)
NonHDL: 96.97
Total CHOL/HDL Ratio: 2
Triglycerides: 74 mg/dL (ref 0.0–149.0)
VLDL: 14.8 mg/dL (ref 0.0–40.0)

## 2021-04-20 LAB — IBC + FERRITIN
Ferritin: 86.4 ng/mL (ref 10.0–291.0)
Iron: 87 ug/dL (ref 42–145)
Saturation Ratios: 24 % (ref 20.0–50.0)
TIBC: 362.6 ug/dL (ref 250.0–450.0)
Transferrin: 259 mg/dL (ref 212.0–360.0)

## 2021-04-20 LAB — HEMOGLOBIN A1C: Hgb A1c MFr Bld: 6 % (ref 4.6–6.5)

## 2021-04-20 NOTE — Assessment & Plan Note (Signed)
Declines Shingrix and tetanus vaccines. ?Mammogram overdue, orders placed. ?Declines colonoscopy, does opt for Cologuard, orders placed. ? ?Discussed the importance of a healthy diet and regular exercise in order for weight loss, and to reduce the risk of further co-morbidity. ? ?Will refer for consultation regarding Lap-Band surgery. ? ?Exam today stable. ?Labs pending. ?

## 2021-04-20 NOTE — Assessment & Plan Note (Signed)
Following with neurology. ?Echocardiogram reviewed from July 2022. ? ?Continue Plavix 75 mg daily, BP control, lipid control.  ?

## 2021-04-20 NOTE — Progress Notes (Signed)
? ?Subjective:  ? ? Patient ID: Leslie Graves, female    DOB: 1960-11-15, 61 y.o.   MRN: [id number] ? ?HPI ? ?Leslie Graves is a very pleasant 61 y.o. female with a history of hypertensive urgency, CVA, abnormal pap smear, morbid obesity, breast cancer, prediabetes who presents today for follow up of chronic conditions and complete physical. ? ?She is considering lab band surgery for weight loss. She's trying to cut back on sweets. She would like to meet with a surgeon to discuss.  ? ?Immunizations: ?-Tetanus: Declines  ?-Influenza: Never completed  ?-Covid-19: 3 vaccines  ?-Shingles: Declines today. ? ?Diet: Fair diet. She's recently changed her diet over the last week.  ?Exercise: No regular exercise. ? ?Eye exam: Completes annually  ?Dental exam: Completed years ago ? ?Pap Smear: Completed years ago, declines today despite recommendations. Last pap on file was from 2015 which showed HGSIL ?Mammogram: Completed years ago ?Colonoscopy: Never completed. Declines today despite recommendation. Opts for Cologuard  ? ? ?BP Readings from Last 3 Encounters:  ?04/20/21 136/78  ?07/13/20 (!) 147/74  ?03/28/20 140/88  ? ? ? ? ?Review of Systems  ?Constitutional:  Negative for unexpected weight change.  ?HENT:  Negative for rhinorrhea.   ?Eyes:  Negative for visual disturbance.  ?Respiratory:  Negative for cough and shortness of breath.   ?Cardiovascular:  Negative for chest pain.  ?Gastrointestinal:  Negative for constipation and diarrhea.  ?Genitourinary:  Negative for difficulty urinating.  ?Musculoskeletal:  Positive for arthralgias.  ?Skin:  Negative for rash.  ?Allergic/Immunologic: Negative for environmental allergies.  ?Neurological:  Negative for dizziness, numbness and headaches.  ?Psychiatric/Behavioral:  The patient is not nervous/anxious.   ? ?   ? ? ?Past Medical History:  ?Diagnosis Date  ? Anemia   ? Breast cancer ([place])   ? 19 years ago-left breast mastectomy-no chemo or radiation  ? Breast lump 10/22/2013  ?  Dysconjugate gaze 08/07/2013  ? Essential familial hyperlipidemia 08/26/2013  ? H/O stroke within last year 09/24/2013  ? High cholesterol   ? Hypertension   ? Numerous moles 08/26/2013  ? RhD negative   ? Stroke [facility]) 7/15  ? also had mini strokes  ? Vision loss of left eye   ? ? ?Social History  ? ?Socioeconomic History  ? Marital status: Married  ?  Spouse name: Not on file  ? Number of children: 4  ? Years of education: MA  ? Highest education level: Not on file  ?Occupational History  ?  Employer: [employer]  ?Tobacco Use  ? Smoking status: Former  ? Smokeless tobacco: Never  ? Tobacco comments:  ?  15-20 years ago  ?Substance and Sexual Activity  ? Alcohol use: No  ? Drug use: No  ? Sexual activity: Yes  ?  Partners: Male  ?Other Topics Concern  ? Not on file  ?Social History Narrative  ? Lives at home with her husband.  ? Right-handed.  ? Caffeine use - 1-1.5 cups coffee per day.  ? ?Social Determinants of Health  ? ?Financial Resource Strain: Not on file  ?Food Insecurity: Not on file  ?Transportation Needs: Not on file  ?Physical Activity: Not on file  ?Stress: Not on file  ?Social Connections: Not on file  ?Intimate Partner Violence: Not on file  ? ? ?Past Surgical History:  ?Procedure Laterality Date  ? LAPAROSCOPIC CHOLECYSTECTOMY  2008  ? MASTECTOMY Left age 52  ? Dr Edison Pace, [facility]  ? TUBAL LIGATION    ? ? ?  Family History  ?Problem Relation Age of Onset  ? Cancer Mother   ?     pancreatic  ? Hypertension Mother   ? Heart attack Father   ? Hypertension Daughter   ? Hypertension Son   ? ? ?Allergies  ?Allergen Reactions  ? Other Nausea And Vomiting  ?  CT dye  ? ? ?Current Outpatient Medications on File Prior to Visit  ?Medication Sig Dispense Refill  ? acetaminophen (TYLENOL) 325 MG tablet Take 650 mg by mouth every 6 (six) hours as needed for mild pain, fever or headache.    ? amLODipine (NORVASC) 5 MG tablet TAKE 1 TABLET BY MOUTH DAILY. FOR BLOOD PRESSURE. OFFICE VISIT REQUIRED FOR FURTHER REFILLS. 30  tablet 0  ? clopidogrel (PLAVIX) 75 MG tablet TAKE 1 TABLET BY MOUTH EVERY DAY 30 tablet 1  ? hydrochlorothiazide (HYDRODIURIL) 25 MG tablet Take 1 tablet (25 mg total) by mouth daily. for blood pressure. Office visit required for further refills. 30 tablet 0  ? Multiple Vitamins-Minerals (PRESERVISION AREDS PO) Take 1 capsule by mouth daily.    ? olmesartan (BENICAR) 40 MG tablet TAKE 1 TABLET BY MOUTH DAILY. FOR BLOOD PRESSURE. OFFICE VISIT REQUIRED FOR FURTHER REFILLS. 30 tablet 0  ? rosuvastatin (CRESTOR) 20 MG tablet Take 0.5 tablets (10 mg total) by mouth daily. for cholesterol. Office visit required for further refills. 15 tablet 0  ? ?No current facility-administered medications on file prior to visit.  ? ? ?BP 136/78   Pulse 77   Ht '5\' 4"'$  (1.626 m)   Wt (!) 322 lb (146.1 kg)   LMP 01/22/2011 Comment: Patient is having vaginal bleeding off and on for the past year  SpO2 98%   BMI 55.27 kg/m?  ?Objective:  ? Physical Exam ?HENT:  ?   Right Ear: Tympanic membrane and ear canal normal.  ?   Left Ear: Tympanic membrane and ear canal normal.  ?   Nose: Nose normal.  ?Eyes:  ?   Conjunctiva/sclera: Conjunctivae normal.  ?   Pupils: Pupils are equal, round, and reactive to light.  ?Neck:  ?   Thyroid: No thyromegaly.  ?Cardiovascular:  ?   Rate and Rhythm: Normal rate and regular rhythm.  ?   Heart sounds: No murmur heard. ?Pulmonary:  ?   Effort: Pulmonary effort is normal.  ?   Breath sounds: Normal breath sounds. No rales.  ?Abdominal:  ?   General: Bowel sounds are normal.  ?   Palpations: Abdomen is soft.  ?   Tenderness: There is no abdominal tenderness.  ?Musculoskeletal:     ?   General: Normal range of motion.  ?   Cervical back: Neck supple.  ?Lymphadenopathy:  ?   Cervical: No cervical adenopathy.  ?Skin: ?   General: Skin is warm and dry.  ?   Findings: No rash.  ?Neurological:  ?   Mental Status: She is alert and oriented to person, place, and time.  ?   Cranial Nerves: No cranial nerve deficit.   ?   Deep Tendon Reflexes: Reflexes are normal and symmetric.  ?Psychiatric:     ?   Mood and Affect: Mood normal.  ? ? ? ? ? ?   ?Assessment & Plan:  ? ? ? ? ?This visit occurred during the SARS-CoV-2 public health emergency.  Safety protocols were in place, including screening questions prior to the visit, additional usage of staff PPE, and extensive cleaning of exam room while observing appropriate contact  time as indicated for disinfecting solutions.  ?

## 2021-04-20 NOTE — Patient Instructions (Signed)
Stop by the lab prior to leaving today. I will notify you of your results once received.  ? ?You will be contacted regarding your referral to general surgery.  Please let us know if you have not been contacted within two weeks.  ? ?Call the [facility] to schedule your mammogram.  ? ?Complete the Cologuard kit once received. ? ?Schedule an appointment to complete your Pap smear. ? ?It was a pleasure to see you today! ? ?Preventive Care 28-63 Years Old, Female ?Preventive care refers to lifestyle choices and visits with your health care provider that can promote health and wellness. Preventive care visits are also called wellness exams. ?What can I expect for my preventive care visit? ?Counseling ?Your health care provider may ask you questions about your: ?Medical history, including: ?Past medical problems. ?Family medical history. ?Pregnancy history. ?Current health, including: ?Menstrual cycle. ?Method of birth control. ?Emotional well-being. ?Home life and relationship well-being. ?Sexual activity and sexual health. ?Lifestyle, including: ?Alcohol, nicotine or tobacco, and drug use. ?Access to firearms. ?Diet, exercise, and sleep habits. ?Work and work Statistician. ?Sunscreen use. ?Safety issues such as seatbelt and bike helmet use. ?Physical exam ?Your health care provider will check your: ?Height and weight. These may be used to calculate your BMI (body mass index). BMI is a measurement that tells if you are at a healthy weight. ?Waist circumference. This measures the distance around your waistline. This measurement also tells if you are at a healthy weight and may help predict your risk of certain diseases, such as type 2 diabetes and high blood pressure. ?Heart rate and blood pressure. ?Body temperature. ?Skin for abnormal spots. ?What immunizations do I need? ?Vaccines are usually given at various ages, according to a schedule. Your health care provider will recommend vaccines for you based on your age,  medical history, and lifestyle or other factors, such as travel or where you work. ?What tests do I need? ?Screening ?Your health care provider may recommend screening tests for certain conditions. This may include: ?Lipid and cholesterol levels. ?Diabetes screening. This is done by checking your blood sugar (glucose) after you have not eaten for a while (fasting). ?Pelvic exam and Pap test. ?Hepatitis B test. ?Hepatitis C test. ?HIV (human immunodeficiency virus) test. ?STI (sexually transmitted infection) testing, if you are at risk. ?Lung cancer screening. ?Colorectal cancer screening. ?Mammogram. Talk with your health care provider about when you should start having regular mammograms. This may depend on whether you have a family history of breast cancer. ?BRCA-related cancer screening. This may be done if you have a family history of breast, ovarian, tubal, or peritoneal cancers. ?Bone density scan. This is done to screen for osteoporosis. ?Talk with your health care provider about your test results, treatment options, and if necessary, the need for more tests. ?Follow these instructions at home: ?Eating and drinking ? ?Eat a diet that includes fresh fruits and vegetables, whole grains, lean protein, and low-fat dairy products. ?Take vitamin and mineral supplements as recommended by your health care provider. ?Do not drink alcohol if: ?Your health care provider tells you not to drink. ?You are pregnant, may be pregnant, or are planning to become pregnant. ?If you drink alcohol: ?Limit how much you have to 0-1 drink a day. ?Know how much alcohol is in your drink. In the [place] one drink equals one 12 oz bottle of beer (355 mL), one 5 oz glass of wine (148 mL), or one 1? oz glass of hard liquor (44 mL). ?Lifestyle ?Brush  your teeth every morning and night with fluoride toothpaste. Floss one time each day. ?Exercise for at least 30 minutes 5 or more days each week. ?Do not use any products that contain nicotine or  tobacco. These products include cigarettes, chewing tobacco, and vaping devices, such as e-cigarettes. If you need help quitting, ask your health care provider. ?Do not use drugs. ?If you are sexually active, practice safe sex. Use a condom or other form of protection to prevent STIs. ?If you do not wish to become pregnant, use a form of birth control. If you plan to become pregnant, see your health care provider for a prepregnancy visit. ?Take aspirin only as told by your health care provider. Make sure that you understand how much to take and what form to take. Work with your health care provider to find out whether it is safe and beneficial for you to take aspirin daily. ?Find healthy ways to manage stress, such as: ?Meditation, yoga, or listening to music. ?Journaling. ?Talking to a trusted person. ?Spending time with friends and family. ?Minimize exposure to UV radiation to reduce your risk of skin cancer. ?Safety ?Always wear your seat belt while driving or riding in a vehicle. ?Do not drive: ?If you have been drinking alcohol. Do not ride with someone who has been drinking. ?When you are tired or distracted. ?While texting. ?If you have been using any mind-altering substances or drugs. ?Wear a helmet and other protective equipment during sports activities. ?If you have firearms in your house, make sure you follow all gun safety procedures. ?Seek help if you have been physically or sexually abused. ?What's next? ?Visit your health care provider once a year for an annual wellness visit. ?Ask your health care provider how often you should have your eyes and teeth checked. ?Stay up to date on all vaccines. ?This information is not intended to replace advice given to you by your health care provider. Make sure you discuss any questions you have with your health care provider. ?Document Revised: 07/05/2020 Document Reviewed: 07/05/2020 ?Elsevier Patient Education ? [place]. ? ?

## 2021-04-20 NOTE — Assessment & Plan Note (Signed)
Following with neurology and ophthalmology. ? ?Continue blood pressure control, lipid control, clopidogrel 75 mg daily ?

## 2021-04-20 NOTE — Assessment & Plan Note (Signed)
Mammogram overdue, she agrees to have this done this Summer. ? ?Orders placed.  ?

## 2021-04-20 NOTE — Assessment & Plan Note (Signed)
Overall controlled. ? ?Continue olmesartan 20 mg daily, HCTZ 25 mg dailiy, amlodipine 5 mg daily. ? ?CMP pending.  ?

## 2021-04-20 NOTE — Assessment & Plan Note (Signed)
Poor diet, no exercise. ? ?Strongly advised she work on lifestyle changes.  ?She is considering lab band surgery.  ?

## 2021-04-20 NOTE — Assessment & Plan Note (Signed)
Discussed the importance of a healthy diet and regular exercise in order for weight loss, and to reduce the risk of further co-morbidity. ? ?Repeat A1C pending. ?

## 2021-04-20 NOTE — Addendum Note (Signed)
Addended by: Ellamae Sia on: 04/20/2021 04:37 PM ? ? Modules accepted: Orders ? ?

## 2021-04-23 LAB — PATHOLOGIST SMEAR REVIEW

## 2021-04-30 ENCOUNTER — Other Ambulatory Visit: Payer: Self-pay | Admitting: Primary Care

## 2021-04-30 DIAGNOSIS — I10 Essential (primary) hypertension: Secondary | ICD-10-CM

## 2021-05-03 ENCOUNTER — Encounter: Payer: Self-pay | Admitting: *Deleted

## 2021-05-07 ENCOUNTER — Other Ambulatory Visit: Payer: Self-pay | Admitting: Primary Care

## 2021-05-07 DIAGNOSIS — I10 Essential (primary) hypertension: Secondary | ICD-10-CM

## 2021-05-07 DIAGNOSIS — Z8673 Personal history of transient ischemic attack (TIA), and cerebral infarction without residual deficits: Secondary | ICD-10-CM

## 2021-05-09 ENCOUNTER — Encounter: Payer: Self-pay | Admitting: Neurology

## 2021-05-09 DIAGNOSIS — Z8673 Personal history of transient ischemic attack (TIA), and cerebral infarction without residual deficits: Secondary | ICD-10-CM

## 2021-05-09 DIAGNOSIS — I10 Essential (primary) hypertension: Secondary | ICD-10-CM

## 2021-05-10 ENCOUNTER — Other Ambulatory Visit: Payer: Self-pay | Admitting: *Deleted

## 2021-05-10 MED ORDER — CLOPIDOGREL BISULFATE 75 MG PO TABS: 75.0000 mg | ORAL_TABLET | Freq: Every day | ORAL | 2 refills | Status: DC

## 2021-05-11 MED ORDER — ROSUVASTATIN CALCIUM 20 MG PO TABS: 20.0000 mg | ORAL_TABLET | Freq: Every day | ORAL | 3 refills | Status: DC

## 2021-05-11 MED ORDER — HYDROCHLOROTHIAZIDE 25 MG PO TABS: 25.0000 mg | ORAL_TABLET | Freq: Every day | ORAL | 3 refills | Status: DC

## 2021-05-11 MED ORDER — AMLODIPINE BESYLATE 5 MG PO TABS: 5.0000 mg | ORAL_TABLET | Freq: Every day | ORAL | 3 refills | Status: DC

## 2021-06-05 ENCOUNTER — Ambulatory Visit: Payer: BC Managed Care – PPO

## 2021-07-05 ENCOUNTER — Encounter: Payer: Self-pay | Admitting: Neurology

## 2021-07-05 ENCOUNTER — Ambulatory Visit: Payer: BC Managed Care – PPO | Admitting: Neurology

## 2021-07-05 VITALS — BP 157/87 | HR 79 | Ht 64.0 in | Wt 323.5 lb

## 2021-07-05 DIAGNOSIS — Z8673 Personal history of transient ischemic attack (TIA), and cerebral infarction without residual deficits: Secondary | ICD-10-CM

## 2021-07-05 DIAGNOSIS — H34232 Retinal artery branch occlusion, left eye: Secondary | ICD-10-CM

## 2021-07-05 MED ORDER — CLOPIDOGREL BISULFATE 75 MG PO TABS
75.0000 mg | ORAL_TABLET | Freq: Every day | ORAL | 3 refills | Status: DC
Start: 2021-07-05 — End: 2021-12-17

## 2021-07-05 NOTE — Patient Instructions (Signed)
I will refill Plavix, PCP can refill going forward Discuss weight loss with primary care Keep BP < 130/90, LDL < 70, A1C < 7 Call for any new issues

## 2021-07-05 NOTE — Progress Notes (Signed)
Chief Complaint  Patient presents with   Follow-up    Rm 15. Alone. Pt denies any recent vision loss.   HISTORICAL  Leslie Graves is a 61 year old female, accompanied by her daughter Sharyn Lull, seen in request by her ophthalmologist Dr. Princess Bruins and her primary care nurse practitioner Vicenta Aly for evaluation of sudden onset of left visual loss on December 22, 2019  I reviewed and summarized the referring note.  Past medical history Hypertension Hyperlipidemia Stroke Breast cancer, status post left mastectomy, Obesity  She was seen by Dr. Leonie Man in the past, presented with sudden onset nausea vomiting on August 07, 2013, with elevated blood pressure, systolic above 119, transient disconjugate eye movement,  MRI of the brain showed weak diffuse positive DWI lesion at left middle cerebellar peduncle, and primary lesion in corpus callosum, subcortical white matter,  Initially there was a concern of multiple sclerosis, MRI cervical spine showed no definite MS plaques  Echocardiogram normal ejection fraction, ultrasound of carotid artery showed no large vessel disease, LDL was elevated to 136, she was treated with aspirin 81 mg daily, and statin Zocor 20 mg daily, blood pressure medication  Her symptoms quickly resolved, think her abnormal MRI findings is most due to hypertensive urgency, small vessel disease,  On Dec 22 2019, when she first got of bed, her vision was normal, around 10 AM, while she was about leaving her house, she suddenly had blurry vision, reached its peak in couple minutes, when she calls her right eye, she realized that it was her left eye causing trouble, she can only see light, but no detail,  She denies headache, no lateralized motor or sensory deficit  She was initially evaluated by optometrist, later referred to ophthalmologist Dr.Govind on January 11, 2020, was diagnosed with left retinal artery occlusion, over the past 3 weeks, she has essentially no  recovery of her vision, she can see black-and-white shadow at her left peripheral vision,  Update July 13, 2020 SS: Here today alone, left vision remains minimal, can see some from periphery, can see light/dark/colors. Stopped seeing eye doctor, didn't feel it was helpful. BP doing better, goal is < 130/90, usually runs in 130's. On Olmesartan, HCTZ, Norvasc, on Crestor, Plavix. Is watching her diet, has close follow-up with PCP.    MRI of the brain in January 2022 showed no acute infarction, but progression of microvascular ischemic change compared to prior scan in 2015, also numerous foci of chronic microhemorrhage.  MRA of the head and neck showed no significant large vessel disease.  Most likely result of poorly controlled HTN.  ECHO has not been completed.   Lipid panel March 2022, LDL 93, cholesterol 170, HDL 59  Update July 05, 2021 SS: Here today alone, doing well, no new issues, 2D echo showed EF 65 to 70% no significant abnormalities.  Saw Dr. Katy Fitch, left vision change likely permanent, when looking through left eye, center vision is darkened.  BP at home runs around 130's.  Remains on Plavix.  Thinking about weight loss injection.  Labs with PCP 04/20/21 A1c 6, LDL 82.  On Norvasc, HCTZ, Benicar, Crestor.   REVIEW OF SYSTEMS: Full 14 system review of systems performed and notable only for above  See HPI  ALLERGIES: Allergies  Allergen Reactions   Other Nausea And Vomiting    CT dye    HOME MEDICATIONS: Current Outpatient Medications  Medication Sig Dispense Refill   acetaminophen (TYLENOL) 325 MG tablet Take 650 mg by mouth every 6 (  six) hours as needed for mild pain, fever or headache.     amLODipine (NORVASC) 5 MG tablet Take 1 tablet (5 mg total) by mouth daily. for blood pressure. 90 tablet 3   clopidogrel (PLAVIX) 75 MG tablet Take 1 tablet (75 mg total) by mouth daily. 30 tablet 2   hydrochlorothiazide (HYDRODIURIL) 25 MG tablet Take 1 tablet (25 mg total) by mouth daily.  for blood pressure. 90 tablet 3   Multiple Vitamins-Minerals (PRESERVISION AREDS PO) Take 1 capsule by mouth daily.     olmesartan (BENICAR) 40 MG tablet Take 1 tablet (40 mg total) by mouth daily. for blood pressure. 90 tablet 3   rosuvastatin (CRESTOR) 20 MG tablet Take 1 tablet (20 mg total) by mouth daily. for cholesterol. 90 tablet 3   No current facility-administered medications for this visit.    PAST MEDICAL HISTORY: Past Medical History:  Diagnosis Date   Anemia    Breast cancer ([place])    19 years ago-left breast mastectomy-no chemo or radiation   Breast lump 10/22/2013   Dysconjugate gaze 08/07/2013   Essential familial hyperlipidemia 08/26/2013   H/O stroke within last year 09/24/2013   High cholesterol    Hypertension    Numerous moles 08/26/2013   RhD negative    Stroke ([place]) 7/15   also had mini strokes   Vision loss of left eye     PAST SURGICAL HISTORY: Past Surgical History:  Procedure Laterality Date   LAPAROSCOPIC CHOLECYSTECTOMY  2008   MASTECTOMY Left age 16   Dr Edison Pace, [facility]   TUBAL LIGATION      FAMILY HISTORY: Family History  Problem Relation Age of Onset   Cancer Mother        pancreatic   Hypertension Mother    Heart attack Father    Hypertension Daughter    Hypertension Son     SOCIAL HISTORY: Social History   Socioeconomic History   Marital status: Married    Spouse name: Not on file   Number of children: 4   Years of education: MA   Highest education level: Not on file  Occupational History    Employer: [employer]  Tobacco Use   Smoking status: Former   Smokeless tobacco: Never   Tobacco comments:    15-20 years ago  Substance and Sexual Activity   Alcohol use: No   Drug use: No   Sexual activity: Yes    Partners: Male  Other Topics Concern   Not on file  Social History Narrative   Lives at home with her husband.   Right-handed.   Caffeine use - 1-1.5 cups coffee per day.   Social Determinants of Health   Financial  Resource Strain: Not on file  Food Insecurity: Not on file  Transportation Needs: Not on file  Physical Activity: Not on file  Stress: Not on file  Social Connections: Not on file  Intimate Partner Violence: Not on file   PHYSICAL EXAM   Vitals:   07/05/21 0928  BP: (!) 157/87  Pulse: 79  Weight: (!) 323 lb 8 oz (146.7 kg)  Height: '5\' 4"'$  (1.626 m)    Not recorded    Body mass index is 55.53 kg/m.  Physical Exam  General: The patient is alert and cooperative at the time of the examination.  Skin: No significant peripheral edema is noted. Obese.  Neurologic Exam  Mental status: The patient is alert and oriented x 3 at the time of the examination. The patient  has apparent normal recent and remote memory, with an apparently normal attention span and concentration ability.  Cranial nerves: Facial symmetry is present. Speech is normal, no aphasia or dysarthria is noted. Extraocular movements are full. Dr. Krista Blue has noted left afferent pupillary defect. Visual fields are full, reported darkening of central vision left eye  Motor: The patient has good strength in all 4 extremities.  Sensory examination: Soft touch sensation is symmetric on the face, arms, and legs.  Coordination: The patient has good finger-nose-finger and heel-to-shin bilaterally.  Gait and station: The patient has a normal gait with evidence of large body habitus   Reflexes: Deep tendon reflexes are symmetric.  DIAGNOSTIC DATA (LABS, IMAGING, TESTING) - I reviewed patient records, labs, notes, testing and imaging myself where available.  ASSESSMENT AND PLAN  Leslie Graves is a 61 y.o. female   Left retinal artery occlusion on January 11, 2020 2.   History of small vessel stroke in 2015  -Continue to work with PCP for management of vascular risk factors, including obesity, HTN  -Goal BP < 130/90, LDL < 70, A1c , 7.0 -Continue Plavix 75 mg daily (was previously on aspirin) -MRI of the brain in January  2022 showed no acute infarction, but progression of microvascular ischemic change compared to prior scan in 2015, also numerous foci of chronic microhemorrhage -MRA of the head and neck showed no significant large vessel disease -Imaging findings most likely result of poorly controlled HTN -2D echo showed EF 65 to 70% no significant abnormalities. -Extensive laboratory evaluation Jan 2022 (RPR, B12, folate, HIV, TSH, CK, CRP, sed rate, A1c, ANA) showed elevated A1C 5.7 -Return here PRN, continue to see PCP for secondary stroke prevention, management of vascular risk factors  Evangeline Dakin, DNP  [facility] [address] [place], [place] [address] [phone]    ,

## 2021-10-09 DIAGNOSIS — H3412 Central retinal artery occlusion, left eye: Secondary | ICD-10-CM | POA: Diagnosis not present

## 2021-10-09 DIAGNOSIS — H35033 Hypertensive retinopathy, bilateral: Secondary | ICD-10-CM | POA: Diagnosis not present

## 2021-10-09 DIAGNOSIS — H25813 Combined forms of age-related cataract, bilateral: Secondary | ICD-10-CM | POA: Diagnosis not present

## 2021-12-14 ENCOUNTER — Other Ambulatory Visit: Payer: Self-pay | Admitting: Neurology

## 2021-12-17 ENCOUNTER — Other Ambulatory Visit: Payer: Self-pay | Admitting: Primary Care

## 2021-12-17 DIAGNOSIS — H34232 Retinal artery branch occlusion, left eye: Secondary | ICD-10-CM

## 2022-03-19 ENCOUNTER — Other Ambulatory Visit: Payer: Self-pay | Admitting: Primary Care

## 2022-03-19 DIAGNOSIS — H34232 Retinal artery branch occlusion, left eye: Secondary | ICD-10-CM

## 2022-03-19 NOTE — Telephone Encounter (Signed)
Lvmtcb, sent mychart message

## 2022-03-19 NOTE — Telephone Encounter (Signed)
Patient is due for CPE/follow up in early April, this will be required prior to any further refills.  Please schedule, thank you!

## 2022-04-14 ENCOUNTER — Other Ambulatory Visit: Payer: Self-pay | Admitting: Primary Care

## 2022-04-14 DIAGNOSIS — H34232 Retinal artery branch occlusion, left eye: Secondary | ICD-10-CM

## 2022-04-23 ENCOUNTER — Encounter: Payer: Self-pay | Admitting: Pharmacist

## 2022-05-01 ENCOUNTER — Encounter: Payer: Self-pay | Admitting: Primary Care

## 2022-05-01 ENCOUNTER — Ambulatory Visit (INDEPENDENT_AMBULATORY_CARE_PROVIDER_SITE_OTHER): Payer: BC Managed Care – PPO | Admitting: Primary Care

## 2022-05-01 VITALS — BP 126/74 | HR 95 | Temp 97.7°F | Ht 64.0 in | Wt 326.0 lb

## 2022-05-01 DIAGNOSIS — Z8673 Personal history of transient ischemic attack (TIA), and cerebral infarction without residual deficits: Secondary | ICD-10-CM

## 2022-05-01 DIAGNOSIS — Z6841 Body Mass Index (BMI) 40.0 and over, adult: Secondary | ICD-10-CM

## 2022-05-01 DIAGNOSIS — I10 Essential (primary) hypertension: Secondary | ICD-10-CM | POA: Diagnosis not present

## 2022-05-01 DIAGNOSIS — H34232 Retinal artery branch occlusion, left eye: Secondary | ICD-10-CM

## 2022-05-01 DIAGNOSIS — Z0000 Encounter for general adult medical examination without abnormal findings: Secondary | ICD-10-CM

## 2022-05-01 DIAGNOSIS — Z1231 Encounter for screening mammogram for malignant neoplasm of breast: Secondary | ICD-10-CM

## 2022-05-01 DIAGNOSIS — R7303 Prediabetes: Secondary | ICD-10-CM

## 2022-05-01 DIAGNOSIS — Z853 Personal history of malignant neoplasm of breast: Secondary | ICD-10-CM

## 2022-05-01 LAB — LIPID PANEL
Cholesterol: 167 mg/dL (ref 0–200)
HDL: 66.2 mg/dL (ref >39.00)
LDL Cholesterol: 76 mg/dL (ref 0–99)
NonHDL: 100.51
Total CHOL/HDL Ratio: 3
Triglycerides: 121 mg/dL (ref 0.0–149.0)
VLDL: 24.2 mg/dL (ref 0.0–40.0)

## 2022-05-01 LAB — COMPREHENSIVE METABOLIC PANEL
ALT: 19 U/L (ref 0–35)
AST: 14 U/L (ref 0–37)
Albumin: 4.3 g/dL (ref 3.5–5.2)
Alkaline Phosphatase: 75 U/L (ref 39–117)
BUN: 18 mg/dL (ref 6–23)
CO2: 25 mEq/L (ref 19–32)
Calcium: 10.1 mg/dL (ref 8.4–10.5)
Chloride: 104 mEq/L (ref 96–112)
Creatinine, Ser: 0.93 mg/dL (ref 0.40–1.20)
GFR: 65.42 mL/min (ref >60.00)
Glucose, Bld: 92 mg/dL (ref 70–99)
Potassium: 3.6 mEq/L (ref 3.5–5.1)
Sodium: 140 mEq/L (ref 135–145)
Total Bilirubin: 0.6 mg/dL (ref 0.2–1.2)
Total Protein: 6.9 g/dL (ref 6.0–8.3)

## 2022-05-01 LAB — IBC + FERRITIN
Ferritin: 76.2 ng/mL (ref 10.0–291.0)
Iron: 88 ug/dL (ref 42–145)
Saturation Ratios: 25.2 % (ref 20.0–50.0)
TIBC: 348.6 ug/dL (ref 250.0–450.0)
Transferrin: 249 mg/dL (ref 212.0–360.0)

## 2022-05-01 LAB — CBC
HCT: 42.1 % (ref 36.0–46.0)
Hemoglobin: 13.7 g/dL (ref 12.0–15.0)
MCHC: 32.5 g/dL (ref 30.0–36.0)
MCV: 79.7 fl (ref 78.0–100.0)
Platelets: 193 10*3/uL (ref 150.0–400.0)
RBC: 5.29 Mil/uL — ABNORMAL HIGH (ref 3.87–5.11)
RDW: 14.2 % (ref 11.5–15.5)
WBC: 4.4 10*3/uL (ref 4.0–10.5)

## 2022-05-01 LAB — TSH: TSH: 2.27 u[IU]/mL (ref 0.35–5.50)

## 2022-05-01 LAB — HEMOGLOBIN A1C: Hgb A1c MFr Bld: 5.8 % (ref 4.6–6.5)

## 2022-05-01 NOTE — Assessment & Plan Note (Signed)
Controlled.  Continue amlodipine 5 mg daily, olmesartan 40 mg daily, and HCTZ 25 mg daily. CMP pending.

## 2022-05-01 NOTE — Assessment & Plan Note (Signed)
No new symptoms.   Continue BP and lipid control. Continue Plavix 75 mg daily and rosuvastatin 20 mg daily.

## 2022-05-01 NOTE — Patient Instructions (Signed)
Stop by the lab prior to leaving today. I will notify you of your results once received.   Complete the Cologuard Kit as discussed.   [facility].   Please reconsider the pap smear.  It was a pleasure to see you today!

## 2022-05-01 NOTE — Assessment & Plan Note (Signed)
Repeat A1C pending.  Discussed the importance of a healthy diet and regular exercise in order for weight loss, and to reduce the risk of further co-morbidity.  

## 2022-05-01 NOTE — Assessment & Plan Note (Signed)
Strongly advised she proceed with mammogram, she agrees. Orders placed.

## 2022-05-01 NOTE — Assessment & Plan Note (Signed)
Candidate for GLP 1 agonist. Checking labs today first.  Consider Ozempic vs Zepbound. Discussed instructions for use and potential side effects.

## 2022-05-01 NOTE — Assessment & Plan Note (Signed)
Following with ophthalmology.  Continue Plavix 75 mg daily and rosuvastatin 20 mg daily.

## 2022-05-01 NOTE — Assessment & Plan Note (Signed)
Due for Shingrix and Tetanus, she declines  Pap smear overdue, declines today despite abnormal history and recommendations.  Mammogram overdue, orders placed. Colonoscopy overdue, she declines but will complete the Cologuard box she has at home.  Discussed the importance of a healthy diet and regular exercise in order for weight loss, and to reduce the risk of further co-morbidity.  Exam stable. Labs pending.  Follow up in 1 year for repeat physical.

## 2022-05-01 NOTE — Progress Notes (Signed)
Subjective:    Patient ID: Leslie Graves, female    DOB: Jan 09, 1961, 62 y.o.   MRN: [id number]  HPI  Leslie FudgeDacia Coste is a very pleasant 62 y.o. female who presents today for complete physical and follow up of chronic conditions.  She would like to discuss her obesity and would like weight loss medication. She is interested in Ozempic. Chronic history of obesity, moreso after the death of her mother several years ago. She struggles with chronic knee pain due to her obesity. She does not exercise.    Diet currently consists of:  Breakfast: Protein shake, protein breakfast bars Lunch: Yemenuna or sandwich Dinner: Baked lean protein, salads  Snacks: Chips, cookies, cakes, candy Desserts: Daily  Beverages: Coffee with Stevia, water, juice, sugar free soda  Exercise: None.   Immunizations: -Tetanus: Completed > 10 years ago. Declines today -Shingles: never completed, declines   Diet: Fair diet.  Exercise: No regular exercise.  Eye exam: Completes annually  Dental exam: Completed several years ago  Pap Smear: 2015 with HGSIL, declined repeat pap smear last year despite recommendations. Declines today.  Mammogram: Years ago, never completed last year as recommended.   Colonoscopy: Never completed, agreed to Cologuard last year, never completed. She agrees to complete, she still has the box at home.    BP Readings from Last 3 Encounters:  05/01/22 126/74  07/05/21 (!) 157/87  04/20/21 136/78      Review of Systems  Constitutional:  Negative for unexpected weight change.  HENT:  Negative for rhinorrhea.   Respiratory:  Negative for cough and shortness of breath.   Cardiovascular:  Negative for chest pain.  Gastrointestinal:  Negative for constipation and diarrhea.  Genitourinary:  Negative for difficulty urinating.  Musculoskeletal:  Negative for arthralgias and myalgias.  Skin:  Negative for rash.  Allergic/Immunologic: Negative for environmental allergies.  Neurological:   Negative for dizziness, numbness and headaches.  Psychiatric/Behavioral:  The patient is not nervous/anxious.          Past Medical History:  Diagnosis Date   Anemia    Breast cancer    19 years ago-left breast mastectomy-no chemo or radiation   Breast lump 10/22/2013   Dysconjugate gaze 08/07/2013   Essential familial hyperlipidemia 08/26/2013   H/O stroke within last year 09/24/2013   High cholesterol    Hypertension    Hypertensive urgency 08/07/2013   Numerous moles 08/26/2013   RhD negative    Stroke 07/2013   also had mini strokes   Vision loss of left eye     Social History   Socioeconomic History   Marital status: Married    Spouse name: Not on file   Number of children: 4   Years of education: MA   Highest education level: Not on file  Occupational History    Employer: [employer]  Tobacco Use   Smoking status: Former   Smokeless tobacco: Never   Tobacco comments:    15-20 years ago  Substance and Sexual Activity   Alcohol use: No   Drug use: No   Sexual activity: Yes    Partners: Male  Other Topics Concern   Not on file  Social History Narrative   Lives at home with her husband.   Right-handed.   Caffeine use - 1-1.5 cups coffee per day.   Social Determinants of Health   Financial Resource Strain: Not on file  Food Insecurity: Not on file  Transportation Needs: Not on file  Physical Activity: Not on file  Stress: Not on file  Social Connections: Not on file  Intimate Partner Violence: Not on file    Past Surgical History:  Procedure Laterality Date   LAPAROSCOPIC CHOLECYSTECTOMY  2008   MASTECTOMY Left age 22   Dr Brooke Dare, [place]   TUBAL LIGATION      Family History  Problem Relation Age of Onset   Cancer Mother        pancreatic   Hypertension Mother    Heart attack Father    Hypertension Daughter    Hypertension Son     Allergies  Allergen Reactions   Other Nausea And Vomiting    CT dye    Current Outpatient  Medications on File Prior to Visit  Medication Sig Dispense Refill   acetaminophen (TYLENOL) 325 MG tablet Take 650 mg by mouth every 6 (six) hours as needed for mild pain, fever or headache.     amLODipine (NORVASC) 5 MG tablet Take 1 tablet (5 mg total) by mouth daily. for blood pressure. 90 tablet 3   clopidogrel (PLAVIX) 75 MG tablet TAKE 1 TABLET BY MOUTH EVERY DAY 30 tablet 0   hydrochlorothiazide (HYDRODIURIL) 25 MG tablet Take 1 tablet (25 mg total) by mouth daily. for blood pressure. 90 tablet 3   Multiple Vitamins-Minerals (PRESERVISION AREDS PO) Take 1 capsule by mouth daily.     olmesartan (BENICAR) 40 MG tablet Take 1 tablet (40 mg total) by mouth daily. for blood pressure. 90 tablet 3   rosuvastatin (CRESTOR) 20 MG tablet Take 1 tablet (20 mg total) by mouth daily. for cholesterol. 90 tablet 3   No current facility-administered medications on file prior to visit.    BP 126/74   Pulse 95   Temp 97.7 F (36.5 C) (Temporal)   Ht 5\' 4"  (1.626 m)   Wt (!) 326 lb (147.9 kg)   LMP 01/22/2011 Comment: Patient is having vaginal bleeding off and on for the past year  SpO2 99%   BMI 55.96 kg/m  Objective:   Physical Exam HENT:     Right Ear: Tympanic membrane and ear canal normal.     Left Ear: Tympanic membrane and ear canal normal.     Nose: Nose normal.  Eyes:     Conjunctiva/sclera: Conjunctivae normal.     Pupils: Pupils are equal, round, and reactive to light.  Neck:     Thyroid: No thyromegaly.  Cardiovascular:     Rate and Rhythm: Normal rate and regular rhythm.     Heart sounds: No murmur heard. Pulmonary:     Effort: Pulmonary effort is normal.     Breath sounds: Normal breath sounds. No rales.  Abdominal:     General: Bowel sounds are normal.     Palpations: Abdomen is soft.     Tenderness: There is no abdominal tenderness.  Musculoskeletal:        General: Normal range of motion.     Cervical back: Neck supple.  Lymphadenopathy:     Cervical: No cervical  adenopathy.  Skin:    General: Skin is warm and dry.     Findings: No rash.  Neurological:     Mental Status: She is alert and oriented to person, place, and time.     Cranial Nerves: No cranial nerve deficit.     Deep Tendon Reflexes: Reflexes are normal and symmetric.  Psychiatric:        Mood and Affect: Mood normal.           Assessment &  Plan:  Preventative health care Assessment & Plan: Due for Shingrix and Tetanus, she declines  Pap smear overdue, declines today despite abnormal history and recommendations.  Mammogram overdue, orders placed. Colonoscopy overdue, she declines but will complete the Cologuard box she has at home.  Discussed the importance of a healthy diet and regular exercise in order for weight loss, and to reduce the risk of further co-morbidity.  Exam stable. Labs pending.  Follow up in 1 year for repeat physical.    Essential (primary) hypertension Assessment & Plan: Controlled.  Continue amlodipine 5 mg daily, olmesartan 40 mg daily, and HCTZ 25 mg daily. CMP pending.  Orders: -     Comprehensive metabolic panel -     CBC -     IBC + Ferritin -     TSH  Retinal artery branch occlusion of left eye Assessment & Plan: Following with ophthalmology.  Continue Plavix 75 mg daily and rosuvastatin 20 mg daily.  Orders: -     Lipid panel  History of CVA (cerebrovascular accident) Assessment & Plan: No new symptoms.   Continue BP and lipid control. Continue Plavix 75 mg daily and rosuvastatin 20 mg daily.  Orders: -     Lipid panel  Personal history of malignant neoplasm of breast Assessment & Plan: Strongly advised she proceed with mammogram, she agrees. Orders placed.    Screening mammogram for breast cancer -     3D Screening Mammogram, Left and Right; Future  Prediabetes Assessment & Plan: Repeat A1C pending.  Discussed the importance of a healthy diet and regular exercise in order for weight loss, and to reduce the  risk of further co-morbidity.   Orders: -     Hemoglobin A1c  Morbid obesity with BMI of 50.0-59.9, adult (HCC) Assessment & Plan: Candidate for GLP 1 agonist. Checking labs today first.  Consider Ozempic vs Zepbound. Discussed instructions for use and potential side effects.         Doreene Nest, NP

## 2022-05-06 ENCOUNTER — Encounter: Payer: Self-pay | Admitting: *Deleted

## 2022-05-11 ENCOUNTER — Other Ambulatory Visit: Payer: Self-pay | Admitting: Primary Care

## 2022-05-11 DIAGNOSIS — H34232 Retinal artery branch occlusion, left eye: Secondary | ICD-10-CM

## 2022-05-15 ENCOUNTER — Other Ambulatory Visit: Payer: Self-pay | Admitting: Primary Care

## 2022-05-15 DIAGNOSIS — Z8673 Personal history of transient ischemic attack (TIA), and cerebral infarction without residual deficits: Secondary | ICD-10-CM

## 2022-05-15 DIAGNOSIS — I10 Essential (primary) hypertension: Secondary | ICD-10-CM

## 2022-05-16 ENCOUNTER — Other Ambulatory Visit: Payer: Self-pay | Admitting: Primary Care

## 2022-05-16 DIAGNOSIS — I10 Essential (primary) hypertension: Secondary | ICD-10-CM

## 2023-05-06 ENCOUNTER — Ambulatory Visit (INDEPENDENT_AMBULATORY_CARE_PROVIDER_SITE_OTHER): Payer: BC Managed Care – PPO | Admitting: Primary Care

## 2023-05-06 ENCOUNTER — Encounter: Payer: Self-pay | Admitting: Primary Care

## 2023-05-06 VITALS — BP 132/84 | HR 79 | Temp 98.1°F | Ht 64.0 in | Wt 343.0 lb

## 2023-05-06 DIAGNOSIS — R7303 Prediabetes: Secondary | ICD-10-CM | POA: Diagnosis not present

## 2023-05-06 DIAGNOSIS — Z0000 Encounter for general adult medical examination without abnormal findings: Secondary | ICD-10-CM

## 2023-05-06 DIAGNOSIS — I10 Essential (primary) hypertension: Secondary | ICD-10-CM | POA: Diagnosis not present

## 2023-05-06 DIAGNOSIS — Z8673 Personal history of transient ischemic attack (TIA), and cerebral infarction without residual deficits: Secondary | ICD-10-CM

## 2023-05-06 DIAGNOSIS — H34232 Retinal artery branch occlusion, left eye: Secondary | ICD-10-CM

## 2023-05-06 DIAGNOSIS — Z1211 Encounter for screening for malignant neoplasm of colon: Secondary | ICD-10-CM

## 2023-05-06 LAB — COMPREHENSIVE METABOLIC PANEL WITH GFR
ALT: 18 U/L (ref 0–35)
AST: 18 U/L (ref 0–37)
Albumin: 4.5 g/dL (ref 3.5–5.2)
Alkaline Phosphatase: 86 U/L (ref 39–117)
BUN: 20 mg/dL (ref 6–23)
CO2: 26 meq/L (ref 19–32)
Calcium: 9.9 mg/dL (ref 8.4–10.5)
Chloride: 103 meq/L (ref 96–112)
Creatinine, Ser: 0.91 mg/dL (ref 0.40–1.20)
GFR: 66.68 mL/min (ref >60.00)
Glucose, Bld: 101 mg/dL — ABNORMAL HIGH (ref 70–99)
Potassium: 4.1 meq/L (ref 3.5–5.1)
Sodium: 139 meq/L (ref 135–145)
Total Bilirubin: 0.7 mg/dL (ref 0.2–1.2)
Total Protein: 7.1 g/dL (ref 6.0–8.3)

## 2023-05-06 LAB — LIPID PANEL
Cholesterol: 174 mg/dL (ref 0–200)
HDL: 74.1 mg/dL (ref >39.00)
LDL Cholesterol: 83 mg/dL (ref 0–99)
NonHDL: 100.34
Total CHOL/HDL Ratio: 2
Triglycerides: 85 mg/dL (ref 0.0–149.0)
VLDL: 17 mg/dL (ref 0.0–40.0)

## 2023-05-06 LAB — HEMOGLOBIN A1C: Hgb A1c MFr Bld: 6.2 % (ref 4.6–6.5)

## 2023-05-06 NOTE — Assessment & Plan Note (Signed)
 No new symptoms.   Continue clopidogrel 75 mg daily, rosuvastatin 20 mg daily, BP control. Repeat lipid panel pending.

## 2023-05-06 NOTE — Patient Instructions (Signed)
 Stop by the lab prior to leaving today. I will notify you of your results once received.   Please think about the Pap smear.  We can do it at our office.  Please think about the mammogram.  Please the Cologuard kit once received.  It was a pleasure to see you today!

## 2023-05-06 NOTE — Assessment & Plan Note (Signed)
 Following with ophthalmology.  Continue clopidogrel 75 mg daily, rosuvastatin 20 mg daily.

## 2023-05-06 NOTE — Assessment & Plan Note (Addendum)
 Declines immunizations.  Pap smear overdue, she declines today despite recommendations.  Mammogram overdue, she declines despite recommendations Colonoscopy overdue, declines colonoscopy but she agrees for Cologuard. Orders placed.  Discussed the importance of a healthy diet and regular exercise in order for weight loss, and to reduce the risk of further co-morbidity.  Exam stable. Labs pending.  Follow up in 1 year for repeat physical.

## 2023-05-06 NOTE — Assessment & Plan Note (Signed)
 Controlled.  Continue amlodipine 5 mg daily, olmesartan 40 mg daily. CMP pending.

## 2023-05-06 NOTE — Assessment & Plan Note (Signed)
 Repeat A1C pending.

## 2023-05-06 NOTE — Progress Notes (Signed)
 Subjective:    Patient ID: Leslie Graves, female    DOB: 1960-02-02, 63 y.o.   MRN: [id number]  HPI  Leslie Graves is a very pleasant 63 y.o. female who presents today for complete physical and follow up of chronic conditions.   Immunizations: -Tetanus: Completed > 10 years ago, declines   -Shingles: Never completed, declines    Diet: Fair diet.  Exercise: No regular exercise.  Eye exam: Completes annually  Dental exam: Completes semi-annually    Pap Smear: Completed in 2015 with HGSIL, declined repeat pap smear in 2023, 2024, and also today Mammogram: Completed years ago, declines despite recommendations.    Colonoscopy: Never completed.   BP Readings from Last 3 Encounters:  05/06/23 132/84  05/01/22 126/74  07/05/21 (!) 157/87   Wt Readings from Last 3 Encounters:  05/06/23 (!) 343 lb (155.6 kg)  05/01/22 (!) 326 lb (147.9 kg)  07/05/21 (!) 323 lb 8 oz (146.7 kg)       Review of Systems  Constitutional:  Negative for unexpected weight change.  HENT:  Negative for rhinorrhea.   Respiratory:  Negative for cough and shortness of breath.   Cardiovascular:  Negative for chest pain.  Gastrointestinal:  Negative for constipation and diarrhea.  Genitourinary:  Negative for difficulty urinating.  Musculoskeletal:  Negative for arthralgias and myalgias.  Skin:  Negative for rash.  Allergic/Immunologic: Negative for environmental allergies.  Neurological:  Negative for dizziness, numbness and headaches.  Psychiatric/Behavioral:  The patient is not nervous/anxious.          Past Medical History:  Diagnosis Date   Anemia    Breast cancer (HCC)    19 years ago-left breast mastectomy-no chemo or radiation   Breast lump 10/22/2013   Dysconjugate gaze 08/07/2013   Essential familial hyperlipidemia 08/26/2013   H/O stroke within last year 09/24/2013   High cholesterol    Hypertension    Hypertensive urgency 08/07/2013   Numerous moles 08/26/2013   RhD negative     Stroke (HCC) 07/2013   also had mini strokes   Vision loss of left eye     Social History   Socioeconomic History   Marital status: Married    Spouse name: Not on file   Number of children: 4   Years of education: MA   Highest education level: Not on file  Occupational History    Employer: [employer]  Tobacco Use   Smoking status: Former   Smokeless tobacco: Never   Tobacco comments:    15-20 years ago  Substance and Sexual Activity   Alcohol use: No   Drug use: No   Sexual activity: Yes    Partners: Male  Other Topics Concern   Not on file  Social History Narrative   Lives at home with her husband.   Right-handed.   Caffeine use - 1-1.5 cups coffee per day.   Social Drivers of Corporate investment banker Strain: Not on file  Food Insecurity: Not on file  Transportation Needs: Not on file  Physical Activity: Not on file  Stress: Not on file  Social Connections: Not on file  Intimate Partner Violence: Not on file    Past Surgical History:  Procedure Laterality Date   LAPAROSCOPIC CHOLECYSTECTOMY  2008   MASTECTOMY Left age 25   Dr Brooke Dare, [place]   TUBAL LIGATION      Family History  Problem Relation Age of Onset   Cancer Mother        pancreatic  Hypertension Mother    Heart attack Father    Hypertension Daughter    Hypertension Son     Allergies  Allergen Reactions   Other Nausea And Vomiting    CT dye    Current Outpatient Medications on File Prior to Visit  Medication Sig Dispense Refill   acetaminophen (TYLENOL) 325 MG tablet Take 650 mg by mouth every 6 (six) hours as needed for mild pain, fever or headache.     amLODipine (NORVASC) 5 MG tablet TAKE 1 TABLET BY MOUTH EVERY DAY FOR BLOOD PRESSURE 90 tablet 3   clopidogrel (PLAVIX) 75 MG tablet TAKE 1 TABLET BY MOUTH EVERY DAY 90 tablet 3   hydrochlorothiazide (HYDRODIURIL) 25 MG tablet TAKE 1 TABLET BY MOUTH EVERY DAY FOR BLOOD PRESSURE 90 tablet 3   Multiple Vitamins-Minerals  (PRESERVISION AREDS PO) Take 1 capsule by mouth daily.     olmesartan (BENICAR) 40 MG tablet TAKE 1 TABLET (40 MG TOTAL) BY MOUTH DAILY FOR BLOOD PRESSURE 90 tablet 3   rosuvastatin (CRESTOR) 20 MG tablet TAKE 1 TABLET BY MOUTH DAILY. FOR CHOLESTEROL 90 tablet 3   No current facility-administered medications on file prior to visit.    BP 132/84   Pulse 79   Temp 98.1 F (36.7 C) (Temporal)   Ht 5\' 4"  (1.626 m)   Wt (!) 343 lb (155.6 kg)   LMP 01/22/2011 Comment: Patient is having vaginal bleeding off and on for the past year  SpO2 98%   BMI 58.88 kg/m  Objective:   Physical Exam HENT:     Right Ear: Tympanic membrane and ear canal normal.     Left Ear: Tympanic membrane and ear canal normal.  Eyes:     Pupils: Pupils are equal, round, and reactive to light.  Cardiovascular:     Rate and Rhythm: Normal rate and regular rhythm.  Pulmonary:     Effort: Pulmonary effort is normal.     Breath sounds: Normal breath sounds.  Abdominal:     General: Bowel sounds are normal.     Palpations: Abdomen is soft.     Tenderness: There is no abdominal tenderness.  Musculoskeletal:        General: Normal range of motion.     Cervical back: Neck supple.  Skin:    General: Skin is warm and dry.  Neurological:     Mental Status: She is alert and oriented to person, place, and time.     Cranial Nerves: No cranial nerve deficit.     Deep Tendon Reflexes:     Reflex Scores:      Patellar reflexes are 2+ on the right side and 2+ on the left side. Psychiatric:        Mood and Affect: Mood normal.           Assessment & Plan:  Preventative health care Assessment & Plan: Declines immunizations.  Pap smear overdue, she declines today despite recommendations.  Mammogram overdue, she declines despite recommendations Colonoscopy overdue, declines colonoscopy but she agrees for Cologuard. Orders placed.  Discussed the importance of a healthy diet and regular exercise in order for weight  loss, and to reduce the risk of further co-morbidity.  Exam stable. Labs pending.  Follow up in 1 year for repeat physical.    Essential (primary) hypertension Assessment & Plan: Controlled.  Continue amlodipine 5 mg daily, olmesartan 40 mg daily. CMP pending.  Orders: -     Lipid panel -     Comprehensive  metabolic panel with GFR  Retinal artery branch occlusion of left eye Assessment & Plan: Following with ophthalmology.  Continue clopidogrel 75 mg daily, rosuvastatin 20 mg daily.   History of CVA (cerebrovascular accident) Assessment & Plan: No new symptoms.   Continue clopidogrel 75 mg daily, rosuvastatin 20 mg daily, BP control. Repeat lipid panel pending.   Prediabetes Assessment & Plan: Repeat A1C pending.  Orders: -     Hemoglobin A1c  Screening for colon cancer -     Cologuard        Gabriel John, NP

## 2023-05-17 ENCOUNTER — Other Ambulatory Visit: Payer: Self-pay | Admitting: Primary Care

## 2023-05-17 DIAGNOSIS — H34232 Retinal artery branch occlusion, left eye: Secondary | ICD-10-CM

## 2023-05-17 DIAGNOSIS — I10 Essential (primary) hypertension: Secondary | ICD-10-CM

## 2023-05-17 DIAGNOSIS — Z8673 Personal history of transient ischemic attack (TIA), and cerebral infarction without residual deficits: Secondary | ICD-10-CM

## 2023-06-20 DIAGNOSIS — H35363 Drusen (degenerative) of macula, bilateral: Secondary | ICD-10-CM | POA: Diagnosis not present

## 2023-06-20 DIAGNOSIS — H35033 Hypertensive retinopathy, bilateral: Secondary | ICD-10-CM | POA: Diagnosis not present

## 2023-06-20 DIAGNOSIS — H2513 Age-related nuclear cataract, bilateral: Secondary | ICD-10-CM | POA: Diagnosis not present

## 2024-05-12 ENCOUNTER — Encounter: Admitting: Primary Care
